# Patient Record
Sex: Female | Born: 1970 | Race: White | Hispanic: No | Marital: Single | Smoking: Never smoker
Health system: Southern US, Community
[De-identification: ages and names within clinical notes are randomized; demographics above are authoritative.]

## PROBLEM LIST (undated history)

## (undated) HISTORY — PX: TUBAL LIGATION: SHX77

---

## 2017-04-26 ENCOUNTER — Encounter: Payer: Self-pay | Admitting: Obstetrics & Gynecology

## 2017-04-26 ENCOUNTER — Ambulatory Visit (INDEPENDENT_AMBULATORY_CARE_PROVIDER_SITE_OTHER): Payer: BLUE CROSS/BLUE SHIELD | Admitting: Obstetrics & Gynecology

## 2017-04-26 VITALS — BP 110/74 | Ht 65.0 in | Wt 216.0 lb

## 2017-04-26 DIAGNOSIS — D219 Benign neoplasm of connective and other soft tissue, unspecified: Secondary | ICD-10-CM

## 2017-04-26 DIAGNOSIS — Z1151 Encounter for screening for human papillomavirus (HPV): Secondary | ICD-10-CM | POA: Diagnosis not present

## 2017-04-26 DIAGNOSIS — Z01419 Encounter for gynecological examination (general) (routine) without abnormal findings: Secondary | ICD-10-CM | POA: Diagnosis not present

## 2017-04-26 DIAGNOSIS — Z9851 Tubal ligation status: Secondary | ICD-10-CM

## 2017-04-26 MED ORDER — PROGESTERONE MICRONIZED 100 MG PO CAPS: 100.0000 mg | ORAL_CAPSULE | Freq: Every day | ORAL | 4 refills | Status: DC

## 2017-04-26 NOTE — Progress Notes (Addendum)
Alexandra [name] [birth year] [id number]   History:    46 y.o. G3P2A1L2  Boyfriend.  S/P Tubal Ligation  RP:  New patient presenting for annual gyn exam and Uterine Fibroid  HPI:  H/O Endometritis on EBx, treated with Vibramycin 01/2016.  Had Menometrorrhagia controled with Cyclic Provera 10 mg x 10 days/month since that time.  Menses regular every month with normal flow on Cyclic Provera.  Patient requesting a "Bioidentical" hormonal therapy.  Pelvic US 06/2016:  Left simple Ovarian Cyst decreased in size from 5.9 cm to 1.8 cm. 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  No pelvic pain, but some pelvic pressure.  No UTI Sx.  BMs wnl.  Breasts wnl.  BMI 35.94.  Past medical history,surgical history, family history and social history were all reviewed and documented in the EPIC chart.  Gynecologic History Patient's last menstrual period was 04/16/2017. Contraception: tubal ligation Last Pap: 06/2016. Results were: Negative/HPV HR neg.  Gono/Chlam neg 06/2016. Last mammogram: 06/2016. Results were: negative  Obstetric History OB History  Gravida Para Term Preterm AB Living  3 2     1 2   SAB TAB Ectopic Multiple Live Births  1            # Outcome Date GA Lbr Len/2nd Weight Sex Delivery Anes PTL Lv  3 SAB           2 Para           1 Para                ROS: A ROS was performed and pertinent positives and negatives are included in the history.  GENERAL: No fevers or chills. HEENT: No change in vision, no earache, sore throat or sinus congestion. NECK: No pain or stiffness. CARDIOVASCULAR: No chest pain or pressure. No palpitations. PULMONARY: No shortness of breath, cough or wheeze. GASTROINTESTINAL: No abdominal pain, nausea, vomiting or diarrhea, melena or bright red blood per rectum. GENITOURINARY: No urinary frequency, urgency, hesitancy or dysuria. MUSCULOSKELETAL: No joint or muscle pain, no back pain, no recent trauma. DERMATOLOGIC: No rash, no itching, no lesions. ENDOCRINE: No polyuria, polydipsia, no  heat or cold intolerance. No recent change in weight. HEMATOLOGICAL: No anemia or easy bruising or bleeding. NEUROLOGIC: No headache, seizures, numbness, tingling or weakness. PSYCHIATRIC: No depression, no loss of interest in normal activity or change in sleep pattern.     Exam:   BP 110/74   Ht 5\' 5"  (1.651 m)   Wt 216 lb (98 kg)   LMP 04/16/2017   BMI 35.94 kg/m   Body mass index is 35.94 kg/m.  General appearance : Well developed well nourished female. No acute distress HEENT: Eyes: no retinal hemorrhage or exudates,  Neck supple, trachea midline, no carotid bruits, no thyroidmegaly Lungs: Clear to auscultation, no rhonchi or wheezes, or rib retractions  Heart: Regular rate and rhythm, no murmurs or gallops Breast:Examined in sitting and supine position were symmetrical in appearance, no palpable masses or tenderness,  no skin retraction, no nipple inversion, no nipple discharge, no skin discoloration, no axillary or supraclavicular lymphadenopathy Abdomen: no palpable masses or tenderness, no rebound or guarding Extremities: no edema or skin discoloration or tenderness  Pelvic: Vulva normal  B[place]olin, Urethra, Skene Glands: Within normal limits             Vagina: No gross lesions or discharge  Cervix: No gross lesions or discharge.  Pap/HR HPV done at patient's request.  Uterus  AV, normal size, shape  and consistency, non-tender and mobile  Adnexa  Without masses or tenderness  Anus and perineum  normal   Labs 04/09/2017: Glucose 84, AST ALT normal, creatinine 0.94 normal, fasting lipid profile with a total cholesterol over HDL ratio at 3.2 and normal triglycerides, TSH normal at 1.46, hemoglobin 13.3, white blood cell 6.6, platelets 282, DHEAS 32.6, estradiol 132.7, progesterone 0.1.   Assessment/Plan:  46 y.o. female for annual exam   1. Encounter for routine gynecological examination with Papanicolaou smear of cervix Normal gynecologic exam.  Pap with high-risk HPV  done.  Breast exam normal.  Screening mammogram negative February 2018.  Recent health labs normal November 2018. - PAP,TP IMGw/HPV RNA,rflx [id number],69/48  2. Special screening examination for human papillomavirus (HPV)  - PAP,TP IMGw/HPV RNA,rflx [id number],09/38  3. Tubal ligation status  4. Fibroid IM Myoma stable at 1.6 cm per Pelvic US 06/2016.  Patient reassured, will observe.  H/O Menometrorrhagia controled with Cyclic Provera 10 mg x 10 days/month.  Patient prefers a BioIdentical hormonal treatment.  Start on Micronized Progesterone 100 mg PO at bedtime every night.  Counseling on above issues more than 50% for 10 minutes  Princess Bruins MD, 2:30 PM 04/26/2017

## 2017-04-28 ENCOUNTER — Encounter: Payer: Self-pay | Admitting: Obstetrics & Gynecology

## 2017-04-28 NOTE — Patient Instructions (Signed)
1. Encounter for routine gynecological examination with Papanicolaou smear of cervix Normal gynecologic exam.  Pap with high-risk HPV done.  Breast exam normal.  Screening mammogram negative February 2018.  Recent health labs normal November 2018. - PAP,TP IMGw/HPV RNA,rflx [id number],01/75  2. Special screening examination for human papillomavirus (HPV)  - PAP,TP IMGw/HPV RNA,rflx [id number],82/42  3. Tubal ligation status Counseling on above issues more than 50% for 4. Fibroid IM Myoma stable at 1.6 cm per Pelvic US 06/2016.  Patient reassured, will observe.  H/O Menometrorrhagia controled with Cyclic Provera 10 mg x 10 days/month.  Patient prefers a BioIdentical hormonal treatment.  Start on Micronized Progesterone 100 mg PO at bedtime every night.  Alexandra [name], it was a pleasure meeting you today!  I will inform you of your results as soon as available.  Health Maintenance, Female Adopting a healthy lifestyle and getting preventive care can go a long way to promote health and wellness. Talk with your health care provider about what schedule of regular examinations is right for you. This is a good chance for you to check in with your provider about disease prevention and staying healthy. In between checkups, there are plenty of things you can do on your own. Experts have done a lot of research about which lifestyle changes and preventive measures are most likely to keep you healthy. Ask your health care provider for more information. Weight and diet Eat a healthy diet  Be sure to include plenty of vegetables, fruits, low-fat dairy products, and lean protein.  Do not eat a lot of foods high in solid fats, added sugars, or salt.  Get regular exercise. This is one of the most important things you can do for your health. ? Most adults should exercise for at least 150 minutes each week. The exercise should increase your heart rate and make you sweat (moderate-intensity exercise). ? Most adults  should also do strengthening exercises at least twice a week. This is in addition to the moderate-intensity exercise.  Maintain a healthy weight  Body mass index (BMI) is a measurement that can be used to identify possible weight problems. It estimates body fat based on height and weight. Your health care provider can help determine your BMI and help you achieve or maintain a healthy weight.  For females 37 years of age and older: ? A BMI below 18.5 is considered underweight. ? A BMI of 18.5 to 24.9 is normal. ? A BMI of 25 to 29.9 is considered overweight. ? A BMI of 30 and above is considered obese.  Watch levels of cholesterol and blood lipids  You should start having your blood tested for lipids and cholesterol at 48 years of age, then have this test every 5 years.  You may need to have your cholesterol levels checked more often if: ? Your lipid or cholesterol levels are high. ? You are older than 48 years of age. ? You are at high risk for heart disease.  Cancer screening Lung Cancer  Lung cancer screening is recommended for adults 18-51 years old who are at high risk for lung cancer because of a history of smoking.  A yearly low-dose CT scan of the lungs is recommended for people who: ? Currently smoke. ? Have quit within the past 15 years. ? Have at least a 30-pack-year history of smoking. A pack year is smoking an average of one pack of cigarettes a day for 1 year.  Yearly screening should continue until it has been 15 years  since you quit.  Yearly screening should stop if you develop a health problem that would prevent you from having lung cancer treatment.  Breast Cancer  Practice breast self-awareness. This means understanding how your breasts normally appear and feel.  It also means doing regular breast self-exams. Let your health care provider know about any changes, no matter how small.  If you are in your 20s or 30s, you should have a clinical breast exam (CBE)  by a health care provider every 1-3 years as part of a regular health exam.  If you are 29 or older, have a CBE every year. Also consider having a breast X-ray (mammogram) every year.  If you have a family history of breast cancer, talk to your health care provider about genetic screening.  If you are at high risk for breast cancer, talk to your health care provider about having an MRI and a mammogram every year.  Breast cancer gene (BRCA) assessment is recommended for women who have family members with BRCA-related cancers. BRCA-related cancers include: ? Breast. ? Ovarian. ? Tubal. ? Peritoneal cancers.  Results of the assessment will determine the need for genetic counseling and BRCA1 and BRCA2 testing.  Cervical Cancer Your health care provider may recommend that you be screened regularly for cancer of the pelvic organs (ovaries, uterus, and vagina). This screening involves a pelvic examination, including checking for microscopic changes to the surface of your cervix (Pap test). You may be encouraged to have this screening done every 3 years, beginning at age 14.  For women ages 63-65, health care providers may recommend pelvic exams and Pap testing every 3 years, or they may recommend the Pap and pelvic exam, combined with testing for human papilloma virus (HPV), every 5 years. Some types of HPV increase your risk of cervical cancer. Testing for HPV may also be done on women of any age with unclear Pap test results.  Other health care providers may not recommend any screening for nonpregnant women who are considered low risk for pelvic cancer and who do not have symptoms. Ask your health care provider if a screening pelvic exam is right for you.  If you have had past treatment for cervical cancer or a condition that could lead to cancer, you need Pap tests and screening for cancer for at least 20 years after your treatment. If Pap tests have been discontinued, your risk factors (such as  having a new sexual partner) need to be reassessed to determine if screening should resume. Some women have medical problems that increase the chance of getting cervical cancer. In these cases, your health care provider may recommend more frequent screening and Pap tests.  Colorectal Cancer  This type of cancer can be detected and often prevented.  Routine colorectal cancer screening usually begins at 48 years of age and continues through 48 years of age.  Your health care provider may recommend screening at an earlier age if you have risk factors for colon cancer.  Your health care provider may also recommend using home test kits to check for hidden blood in the stool.  A small camera at the end of a tube can be used to examine your colon directly (sigmoidoscopy or colonoscopy). This is done to check for the earliest forms of colorectal cancer.  Routine screening usually begins at age 106.  Direct examination of the colon should be repeated every 5-10 years through 48 years of age. However, you may need to be screened more often if  early forms of precancerous polyps or small growths are found.  Skin Cancer  Check your skin from head to toe regularly.  Tell your health care provider about any new moles or changes in moles, especially if there is a change in a mole's shape or color.  Also tell your health care provider if you have a mole that is larger than the size of a pencil eraser.  Always use sunscreen. Apply sunscreen liberally and repeatedly throughout the day.  Protect yourself by wearing long sleeves, pants, a wide-brimmed hat, and sunglasses whenever you are outside.  Heart disease, diabetes, and high blood pressure  High blood pressure causes heart disease and increases the risk of stroke. High blood pressure is more likely to develop in: ? People who have blood pressure in the high end of the normal range (130-139/85-89 mm Hg). ? People who are overweight or  obese. ? People who are African American.  If you are 90-52 years of age, have your blood pressure checked every 3-5 years. If you are 40 years of age or older, have your blood pressure checked every year. You should have your blood pressure measured twice-once when you are at a hospital or clinic, and once when you are not at a hospital or clinic. Record the average of the two measurements. To check your blood pressure when you are not at a hospital or clinic, you can use: ? An automated blood pressure machine at a pharmacy. ? A home blood pressure monitor.  If you are between 13 years and 26 years old, ask your health care provider if you should take aspirin to prevent strokes.  Have regular diabetes screenings. This involves taking a blood sample to check your fasting blood sugar level. ? If you are at a normal weight and have a low risk for diabetes, have this test once every three years after 48 years of age. ? If you are overweight and have a high risk for diabetes, consider being tested at a younger age or more often. Preventing infection Hepatitis B  If you have a higher risk for hepatitis B, you should be screened for this virus. You are considered at high risk for hepatitis B if: ? You were born in a country where hepatitis B is common. Ask your health care provider which countries are considered high risk. ? Your parents were born in a high-risk country, and you have not been immunized against hepatitis B (hepatitis B vaccine). ? You have HIV or AIDS. ? You use needles to inject street drugs. ? You live with someone who has hepatitis B. ? You have had sex with someone who has hepatitis B. ? You get hemodialysis treatment. ? You take certain medicines for conditions, including cancer, organ transplantation, and autoimmune conditions.  Hepatitis C  Blood testing is recommended for: ? Everyone born from [birth year] through 1967. ? Anyone with known risk factors for hepatitis  C.  Sexually transmitted infections (STIs)  You should be screened for sexually transmitted infections (STIs) including gonorrhea and chlamydia if: ? You are sexually active and are younger than 48 years of age. ? You are older than 48 years of age and your health care provider tells you that you are at risk for this type of infection. ? Your sexual activity has changed since you were last screened and you are at an increased risk for chlamydia or gonorrhea. Ask your health care provider if you are at risk.  If you do not have  HIV, but are at risk, it may be recommended that you take a prescription medicine daily to prevent HIV infection. This is called pre-exposure prophylaxis (PrEP). You are considered at risk if: ? You are sexually active and do not regularly use condoms or know the HIV status of your partner(s). ? You take drugs by injection. ? You are sexually active with a partner who has HIV.  Talk with your health care provider about whether you are at high risk of being infected with HIV. If you choose to begin PrEP, you should first be tested for HIV. You should then be tested every 3 months for as long as you are taking PrEP. Pregnancy  If you are premenopausal and you may become pregnant, ask your health care provider about preconception counseling.  If you may become pregnant, take 400 to 800 micrograms (mcg) of folic acid every day.  If you want to prevent pregnancy, talk to your health care provider about birth control (contraception). Osteoporosis and menopause  Osteoporosis is a disease in which the bones lose minerals and strength with aging. This can result in serious bone fractures. Your risk for osteoporosis can be identified using a bone density scan.  If you are 64 years of age or older, or if you are at risk for osteoporosis and fractures, ask your health care provider if you should be screened.  Ask your health care provider whether you should take a calcium or  vitamin D supplement to lower your risk for osteoporosis.  Menopause may have certain physical symptoms and risks.  Hormone replacement therapy may reduce some of these symptoms and risks. Talk to your health care provider about whether hormone replacement therapy is right for you. Follow these instructions at home:  Schedule regular health, dental, and eye exams.  Stay current with your immunizations.  Do not use any tobacco products including cigarettes, chewing tobacco, or electronic cigarettes.  If you are pregnant, do not drink alcohol.  If you are breastfeeding, limit how much and how often you drink alcohol.  Limit alcohol intake to no more than 1 drink per day for nonpregnant women. One drink equals 12 ounces of beer, 5 ounces of wine, or 1 ounces of hard liquor.  Do not use street drugs.  Do not share needles.  Ask your health care provider for help if you need support or information about quitting drugs.  Tell your health care provider if you often feel depressed.  Tell your health care provider if you have ever been abused or do not feel safe at home. This information is not intended to replace advice given to you by your health care provider. Make sure you discuss any questions you have with your health care provider. Document Released: 11/28/2010 Document Revised: 10/21/2015 Document Reviewed: 02/16/2015 Elsevier Interactive Patient Education  [employer].

## 2017-05-01 LAB — PAP, TP IMAGING W/ HPV RNA, RFLX HPV TYPE 16,18/45: HPV DNA HIGH RISK: NOT DETECTED

## 2018-02-20 ENCOUNTER — Telehealth: Payer: Self-pay | Admitting: *Deleted

## 2018-02-20 NOTE — Telephone Encounter (Signed)
Patient called back c/o green vaginal discharge, transferred to appointment desk to schedule visit.

## 2018-02-20 NOTE — Telephone Encounter (Signed)
Patient called and left message in triage voicemail c/o issues, I called and received VM, message left.

## 2018-02-25 ENCOUNTER — Ambulatory Visit (INDEPENDENT_AMBULATORY_CARE_PROVIDER_SITE_OTHER): Payer: BLUE CROSS/BLUE SHIELD | Admitting: Obstetrics & Gynecology

## 2018-02-25 ENCOUNTER — Encounter: Payer: Self-pay | Admitting: Obstetrics & Gynecology

## 2018-02-25 VITALS — BP 124/86

## 2018-02-25 DIAGNOSIS — N898 Other specified noninflammatory disorders of vagina: Secondary | ICD-10-CM | POA: Diagnosis not present

## 2018-02-25 DIAGNOSIS — N951 Menopausal and female climacteric states: Secondary | ICD-10-CM | POA: Diagnosis not present

## 2018-02-25 DIAGNOSIS — Z113 Encounter for screening for infections with a predominantly sexual mode of transmission: Secondary | ICD-10-CM

## 2018-02-25 LAB — WET PREP FOR TRICH, YEAST, CLUE

## 2018-02-25 NOTE — Progress Notes (Signed)
    Alexandra [name] [birth year] [id number]        47 y.o.  [id number] New partner  RP: Greenish vaginal discharge  HPI: Unprotected IC with new partner 1 1/2 week ago.  Greenish vaginal discharge since then.  No pelvic pain.  No fever.  Perimenopausal, had withdrawal bleeding with Prometrium every month.  stopped Prometrium 2 cycles ago.  No vaginal bleeding since then.   OB History  Gravida Para Term Preterm AB Living  3 2     1 2   SAB TAB Ectopic Multiple Live Births  1            # Outcome Date GA Lbr Len/2nd Weight Sex Delivery Anes PTL Lv  3 SAB           2 Para           1 Para             Past medical history,surgical history, problem list, medications, allergies, family history and social history were all reviewed and documented in the EPIC chart.   Directed ROS with pertinent positives and negatives documented in the history of present illness/assessment and plan.  Exam:  Vitals:   02/25/18 1620  BP: 124/86   General appearance:  Normal  Abdomen: Normal  Gynecologic exam: Vulva normal.  Speculum:  Cervix normal.  Vagina normal.  Increased vaginal discharge.  Wet prep done.  Gono-Chlam done.  Bimanual exam:  Cervix non-tender.  Uterus AV, mobile, normal volume, NT.  No adnexal mass felt.  Wet prep negative   Assessment/Plan:  47 y.o. [id number]   1. Vaginal discharge Wet prep negative.  Patient informed.  Pending Gono-Chlam. - WET PREP FOR [place], YEAST, CLUE  2. Screen for STD (sexually transmitted disease) Strict condom use strongly recommended. - HIV antibody (with reflex) - RPR - Hepatitis C Antibody - Hepatitis B Surface AntiGEN - CBC w/Diff  3. Perimenopause Unprotected IC.  Will verify sPT.  Probably perimenopausal.  Will verify [place] today.  Recommend cyclic progesterone every 3 months if no spontaneous menstrual period. - FSH - hCG, serum, qualitative  F/U Annual/Gyn visit in 03/2018.  Counseling on above issues and coordination of care >50% x 25  minutes  Princess Bruins MD, 4:31 PM 02/25/2018

## 2018-02-26 ENCOUNTER — Encounter: Payer: Self-pay | Admitting: Obstetrics & Gynecology

## 2018-02-26 LAB — FOLLICLE STIMULATING HORMONE: FSH: 11.1 m[IU]/mL

## 2018-02-26 LAB — CBC WITH DIFFERENTIAL/PLATELET
BASOS ABS: 38 {cells}/uL (ref 0–200)
BASOS PCT: 0.4 %
Eosinophils Absolute: 19 cells/uL (ref 15–500)
Eosinophils Relative: 0.2 %
HCT: 41.3 % (ref 35.0–45.0)
Hemoglobin: 13.9 g/dL (ref 11.7–15.5)
Lymphs Abs: 1306 cells/uL (ref 850–3900)
MCH: 30.3 pg (ref 27.0–33.0)
MCHC: 33.7 g/dL (ref 32.0–36.0)
MCV: 90 fL (ref 80.0–100.0)
MPV: 10.8 fL (ref 7.5–12.5)
Monocytes Relative: 6.3 %
Neutro Abs: 7632 cells/uL (ref 1500–7800)
Neutrophils Relative %: 79.5 %
Platelets: 348 10*3/uL (ref 140–400)
RBC: 4.59 10*6/uL (ref 3.80–5.10)
RDW: 12.7 % (ref 11.0–15.0)
Total Lymphocyte: 13.6 %
WBC mixed population: 605 cells/uL (ref 200–950)
WBC: 9.6 10*3/uL (ref 3.8–10.8)

## 2018-02-26 LAB — HIV ANTIBODY (ROUTINE TESTING W REFLEX): HIV: NONREACTIVE

## 2018-02-26 LAB — RPR: RPR Ser Ql: NONREACTIVE

## 2018-02-26 LAB — HEPATITIS B SURFACE ANTIGEN: HEP B S AG: NONREACTIVE

## 2018-02-26 LAB — C. TRACHOMATIS/N. GONORRHOEAE RNA
C. TRACHOMATIS RNA, TMA: NOT DETECTED
N. gonorrhoeae RNA, TMA: NOT DETECTED

## 2018-02-26 LAB — HCG, SERUM, QUALITATIVE: Preg, Serum: NEGATIVE

## 2018-02-26 LAB — HEPATITIS C ANTIBODY
Hepatitis C Ab: NONREACTIVE
SIGNAL TO CUT-OFF: 0.02 (ref <1.00)

## 2018-02-26 NOTE — Patient Instructions (Signed)
1. Vaginal discharge Wet prep negative.  Patient informed.  Pending Gono-Chlam. - WET PREP FOR [place], YEAST, CLUE  2. Screen for STD (sexually transmitted disease) Strict condom use strongly recommended. - HIV antibody (with reflex) - RPR - Hepatitis C Antibody - Hepatitis B Surface AntiGEN - CBC w/Diff  3. Perimenopause Unprotected IC.  Will verify sPT.  Probably perimenopausal.  Will verify [place] today.  Recommend cyclic progesterone every 3 months if no spontaneous menstrual period. - FSH - hCG, serum, qualitative  F/U Annual/Gyn visit in 03/2018.  Alexandra [name], good seeing you today!  I will inform you of your results as soon as they are available.

## 2018-07-02 ENCOUNTER — Ambulatory Visit (INDEPENDENT_AMBULATORY_CARE_PROVIDER_SITE_OTHER): Payer: BLUE CROSS/BLUE SHIELD | Admitting: Obstetrics & Gynecology

## 2018-07-02 ENCOUNTER — Encounter: Payer: Self-pay | Admitting: Obstetrics & Gynecology

## 2018-07-02 VITALS — BP 116/70 | Ht 65.0 in | Wt 198.0 lb

## 2018-07-02 DIAGNOSIS — Z1151 Encounter for screening for human papillomavirus (HPV): Secondary | ICD-10-CM

## 2018-07-02 DIAGNOSIS — Z9851 Tubal ligation status: Secondary | ICD-10-CM

## 2018-07-02 DIAGNOSIS — Z01419 Encounter for gynecological examination (general) (routine) without abnormal findings: Secondary | ICD-10-CM

## 2018-07-02 NOTE — Addendum Note (Signed)
Addended by: Thurnell Garbe A on: 07/02/2018 04:45 PM   Modules accepted: Orders

## 2018-07-02 NOTE — Progress Notes (Signed)
Alexandra [name] 1970/08/23 [id number]   History:    48 y.o. G3P2A1L2  Single  S/P TL.  RP:  Established patient presenting for annual gyn exam   HPI: Menses regular every month usually with normal flow for about 5 days.  Occasionally has mildly heavier flow lasting longer, those months patient uses Prometrium 100 mg daily to slow down the flow and stop the period.  No breakthrough bleeding.  No pelvic pain.  Currently abstinent.  Breasts normal.  Decreased weight since last year but modifying her nutrition and increasing aerobic activities.  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  Per patient percentage of fat is now decreased at 29%.  Body mass index is at 32.95 but with heavy muscle mass.  Health labs with family physician.  Past medical history,surgical history, family history and social history were all reviewed and documented in the EPIC chart.  Gynecologic History Patient's last menstrual period was 06/04/2018. Contraception: tubal ligation Last Pap: 03/2017. Results were: Negative/HPV HR neg Last mammogram: Done today, pending results Bone Density: Never Colonoscopy: 6 yrs ago normal, 10 yr schedule  Obstetric History OB History  Gravida Para Term Preterm AB Living  3 2     1 2   SAB TAB Ectopic Multiple Live Births  1            # Outcome Date GA Lbr Len/2nd Weight Sex Delivery Anes PTL Lv  3 SAB           2 Para           1 Para              ROS: A ROS was performed and pertinent positives and negatives are included in the history.  GENERAL: No fevers or chills. HEENT: No change in vision, no earache, sore throat or sinus congestion. NECK: No pain or stiffness. CARDIOVASCULAR: No chest pain or pressure. No palpitations. PULMONARY: No shortness of breath, cough or wheeze. GASTROINTESTINAL: No abdominal pain, nausea, vomiting or diarrhea, melena or bright red blood per rectum. GENITOURINARY: No urinary frequency, urgency, hesitancy or dysuria.  MUSCULOSKELETAL: No joint or muscle pain, no back pain, no recent trauma. DERMATOLOGIC: No rash, no itching, no lesions. ENDOCRINE: No polyuria, polydipsia, no heat or cold intolerance. No recent change in weight. HEMATOLOGICAL: No anemia or easy bruising or bleeding. NEUROLOGIC: No headache, seizures, numbness, tingling or weakness. PSYCHIATRIC: No depression, no loss of interest in normal activity or change in sleep pattern.     Exam:   BP 116/70   Ht 5\' 5"  (1.651 m)   Wt 198 lb (89.8 kg)   LMP 06/04/2018   BMI 32.95 kg/m   Body mass index is 32.95 kg/m.  General appearance : Well developed well nourished female. No acute distress HEENT: Eyes: no retinal hemorrhage or exudates,  Neck supple, trachea midline, no carotid bruits, no thyroidmegaly Lungs: Clear to auscultation, no rhonchi or wheezes, or rib retractions  Heart: Regular rate and rhythm, no murmurs or gallops Breast:Examined in sitting and supine position were symmetrical in appearance, no palpable masses or tenderness,  no skin retraction, no nipple inversion, no nipple discharge, no skin discoloration, no axillary or supraclavicular lymphadenopathy Abdomen: no palpable masses or tenderness, no rebound or guarding Extremities: no edema or skin discoloration or tenderness  Pelvic: Vulva: Normal             Vagina: No gross lesions or discharge  Cervix: No gross lesions or discharge.  Pap/HPV HR done  Uterus  AV, normal size, shape and consistency, non-tender and mobile  Adnexa  Without masses or tenderness  Anus: Normal   Assessment/Plan:  48 y.o. female for annual exam   1. Encounter for routine gynecological examination with Papanicolaou smear of cervix Normal gynecologic exam.  Menstrual periods every month, uses Prometrium to help decrease the bleeding when occasionally longer and heavier.  Will call if needs a re-prescription of the Prometrium 100 mg as needed.  Pap test with high risk HPV done today given  unprotected intercourse since last Pap test.  Breast exam normal.  Screening mammogram done today, pending final results.  Colonoscopy 6 years ago.  Health labs with family physician.  Body mass index 32.95.  With better than average muscle mass, as patient is a Product manager.  Started swimming regularly.  Patient lost weight since last annual exam.  Healthy nutrition.  2. S/P tubal ligation   Princess Bruins MD, 3:29 PM 07/02/2018

## 2018-07-02 NOTE — Patient Instructions (Signed)
1. Encounter for routine gynecological examination with Papanicolaou smear of cervix Normal gynecologic exam.  Menstrual periods every month, uses Prometrium to help decrease the bleeding when occasionally longer and heavier.  Will call if needs a re-prescription of the Prometrium 100 mg as needed.  Pap test with high risk HPV done today given unprotected intercourse since last Pap test.  Breast exam normal.  Screening mammogram done today, pending final results.  Colonoscopy 6 years ago.  Health labs with family physician.  Body mass index 32.95.  With better than average muscle mass, as patient is a Product manager.  Started swimming regularly.  Patient lost weight since last annual exam.  Healthy nutrition.  2. S/P tubal ligation  Alexandra [name], it was a pleasure seeing you today!  I will inform you of your results as soon as they are available

## 2018-07-03 LAB — PAP, TP IMAGING W/ HPV RNA, RFLX HPV TYPE 16,18/45: HPV DNA High Risk: NOT DETECTED

## 2018-10-25 ENCOUNTER — Telehealth: Payer: Self-pay | Admitting: *Deleted

## 2018-10-25 MED ORDER — PROGESTERONE MICRONIZED 100 MG PO CAPS: 100.0000 mg | ORAL_CAPSULE | Freq: Every day | ORAL | 3 refills | Status: AC

## 2018-10-25 NOTE — Telephone Encounter (Signed)
Patient called requesting refill on Progesterone 100 mg cap per note on 07/02/18 "Will call if needs a re-prescription of the Prometrium 100 mg as needed."  Should patient have #30 tablets? Any refills?

## 2018-10-25 NOTE — Telephone Encounter (Signed)
Rx sent,

## 2018-10-25 NOTE — Telephone Encounter (Signed)
Can refill Prometrium 100 mg 1 tab PO HS #90, refill x 3.

## 2019-07-04 ENCOUNTER — Encounter: Payer: BLUE CROSS/BLUE SHIELD | Admitting: Obstetrics & Gynecology

## 2019-07-11 ENCOUNTER — Encounter: Payer: Self-pay | Admitting: Obstetrics & Gynecology

## 2019-07-14 ENCOUNTER — Encounter: Payer: Self-pay | Admitting: Obstetrics & Gynecology

## 2021-04-22 ENCOUNTER — Emergency Department (HOSPITAL_BASED_OUTPATIENT_CLINIC_OR_DEPARTMENT_OTHER): Payer: No Typology Code available for payment source

## 2021-04-22 ENCOUNTER — Other Ambulatory Visit: Payer: Self-pay

## 2021-04-22 ENCOUNTER — Encounter (HOSPITAL_BASED_OUTPATIENT_CLINIC_OR_DEPARTMENT_OTHER): Payer: Self-pay

## 2021-04-22 ENCOUNTER — Emergency Department (HOSPITAL_BASED_OUTPATIENT_CLINIC_OR_DEPARTMENT_OTHER)
Admission: EM | Admit: 2021-04-22 | Discharge: 2021-04-22 | Disposition: A | Discharge status: Home / Self Care | Payer: No Typology Code available for payment source | Attending: Emergency Medicine | Admitting: Emergency Medicine

## 2021-04-22 DIAGNOSIS — X509XXA Other and unspecified overexertion or strenuous movements or postures, initial encounter: Secondary | ICD-10-CM | POA: Diagnosis not present

## 2021-04-22 DIAGNOSIS — S4291XA Fracture of right shoulder girdle, part unspecified, initial encounter for closed fracture: Secondary | ICD-10-CM | POA: Diagnosis not present

## 2021-04-22 DIAGNOSIS — Y9239 Other specified sports and athletic area as the place of occurrence of the external cause: Secondary | ICD-10-CM | POA: Diagnosis not present

## 2021-04-22 DIAGNOSIS — S4991XA Unspecified injury of right shoulder and upper arm, initial encounter: Secondary | ICD-10-CM | POA: Diagnosis present

## 2021-04-22 DIAGNOSIS — S43004A Unspecified dislocation of right shoulder joint, initial encounter: Secondary | ICD-10-CM | POA: Diagnosis not present

## 2021-04-22 IMAGING — DX DG SHOULDER 2+V*R*
2 series · 2 of 2 positions shown · non-contrast
Comparison: None.

CLINICAL DATA: Recurrent shoulder dislocations. Patient reports
shoulder being reduced earlier today.

EXAM:
RIGHT SHOULDER - 2+ VIEW

[shoulder axillary]
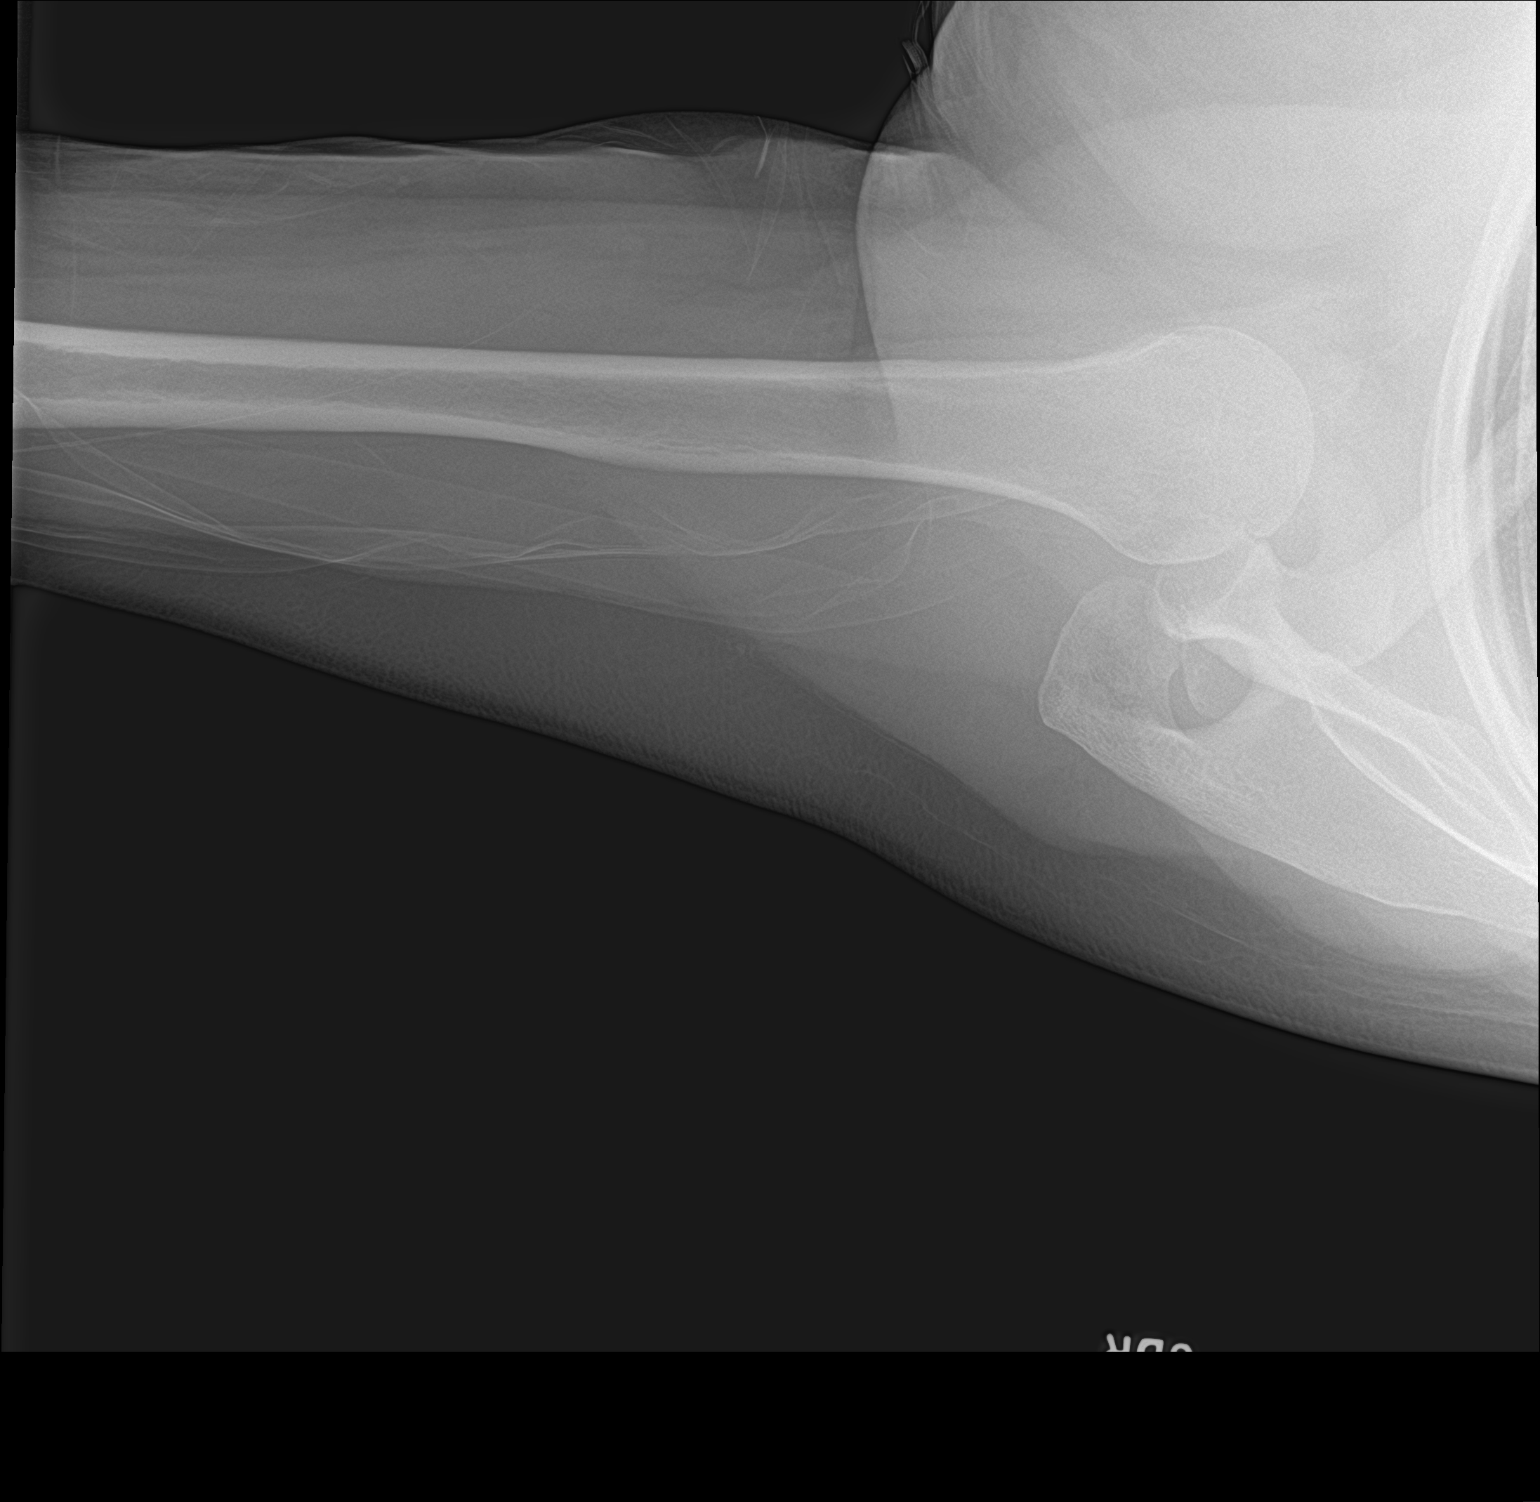

[shoulder ap neutral]
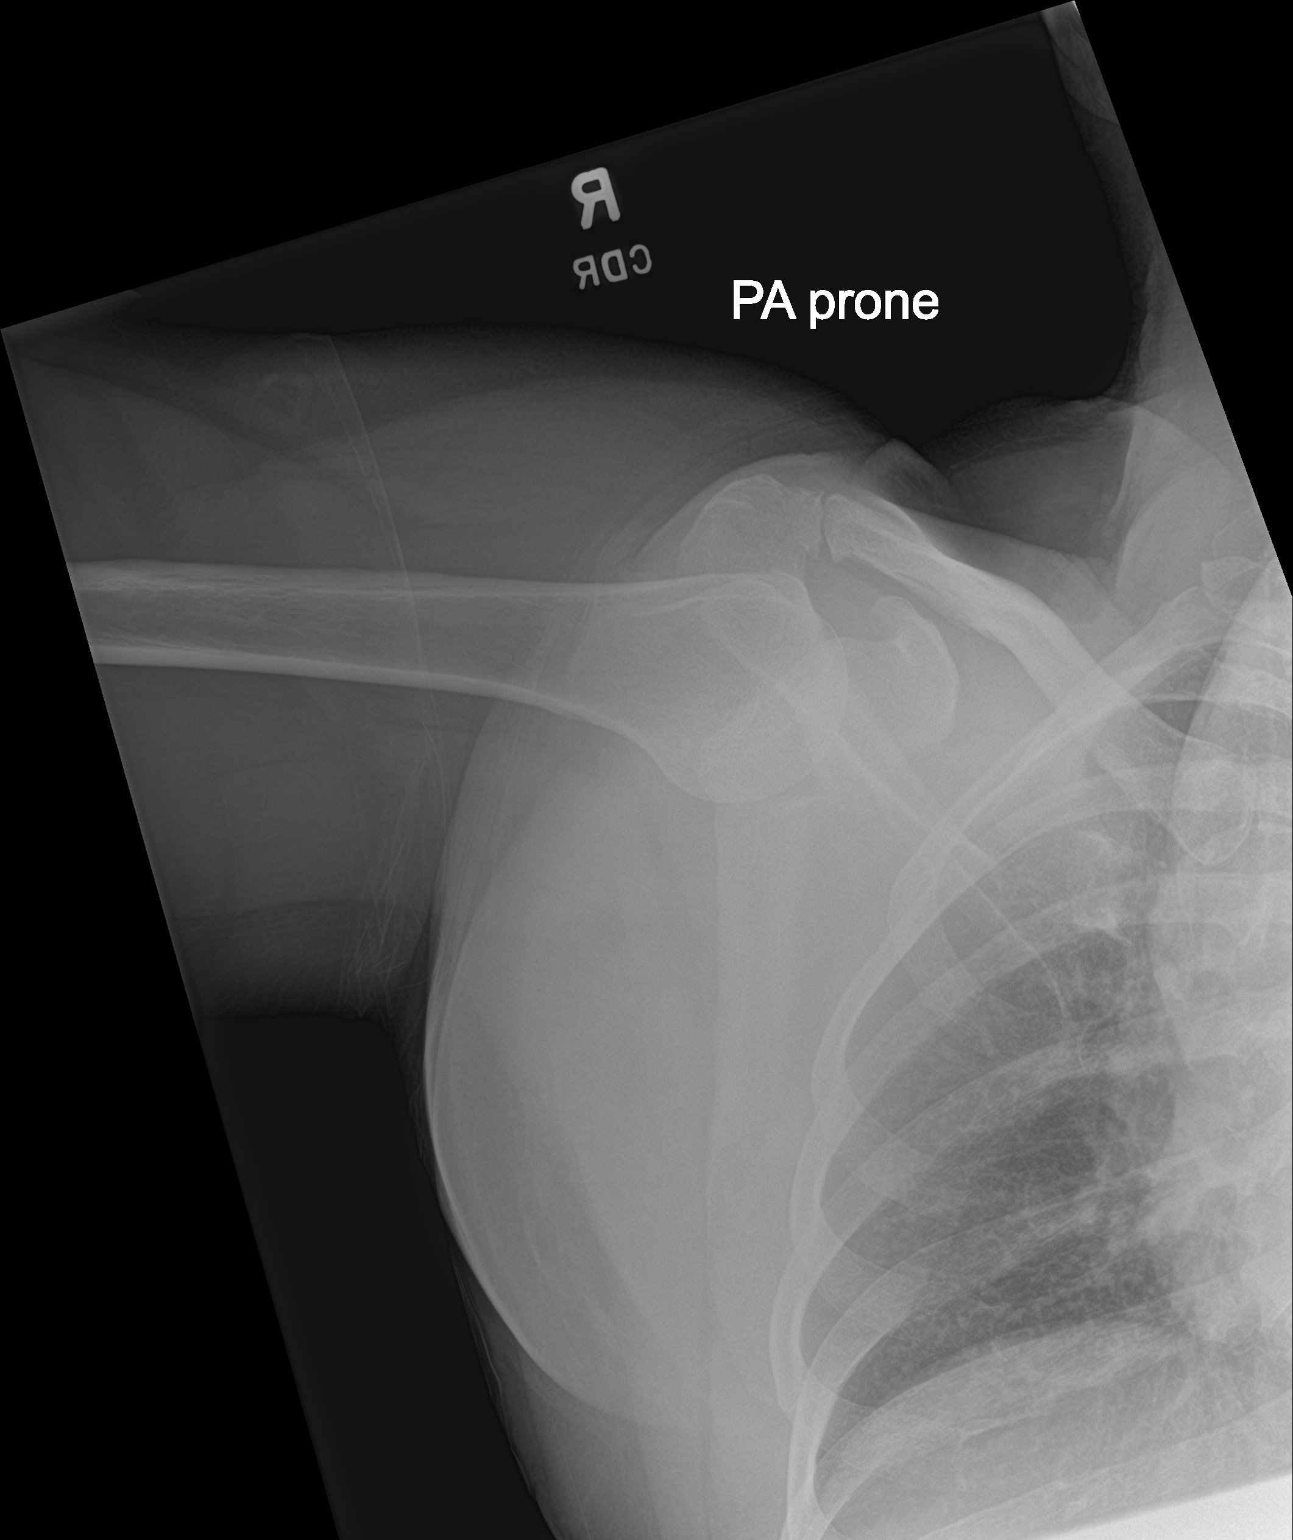

[2 of 2 positions shown; findings below may reference images not displayed]

FINDINGS: PA prone and axillary views are submitted. On the axillary view, the
humeral head is anteriorly dislocated relative to the glenoid. There
is a probable small Hill-Sachs deformity. On the PA view, the
shoulder is abducted. No scapular fracture identified.
IMPRESSION: Anterior dislocation of the glenohumeral joint with probable small
Hill-Sachs deformity.

## 2021-04-22 IMAGING — DX DG SHOULDER 2+V*R*
1 series · 1 of 1 positions shown · non-contrast
Comparison: [DATE] [DATE], [DATE] ([DATE] p.m.)

CLINICAL DATA: Post reduction views.

EXAM:
RIGHT SHOULDER - 2+ VIEW

[shoulder ap]
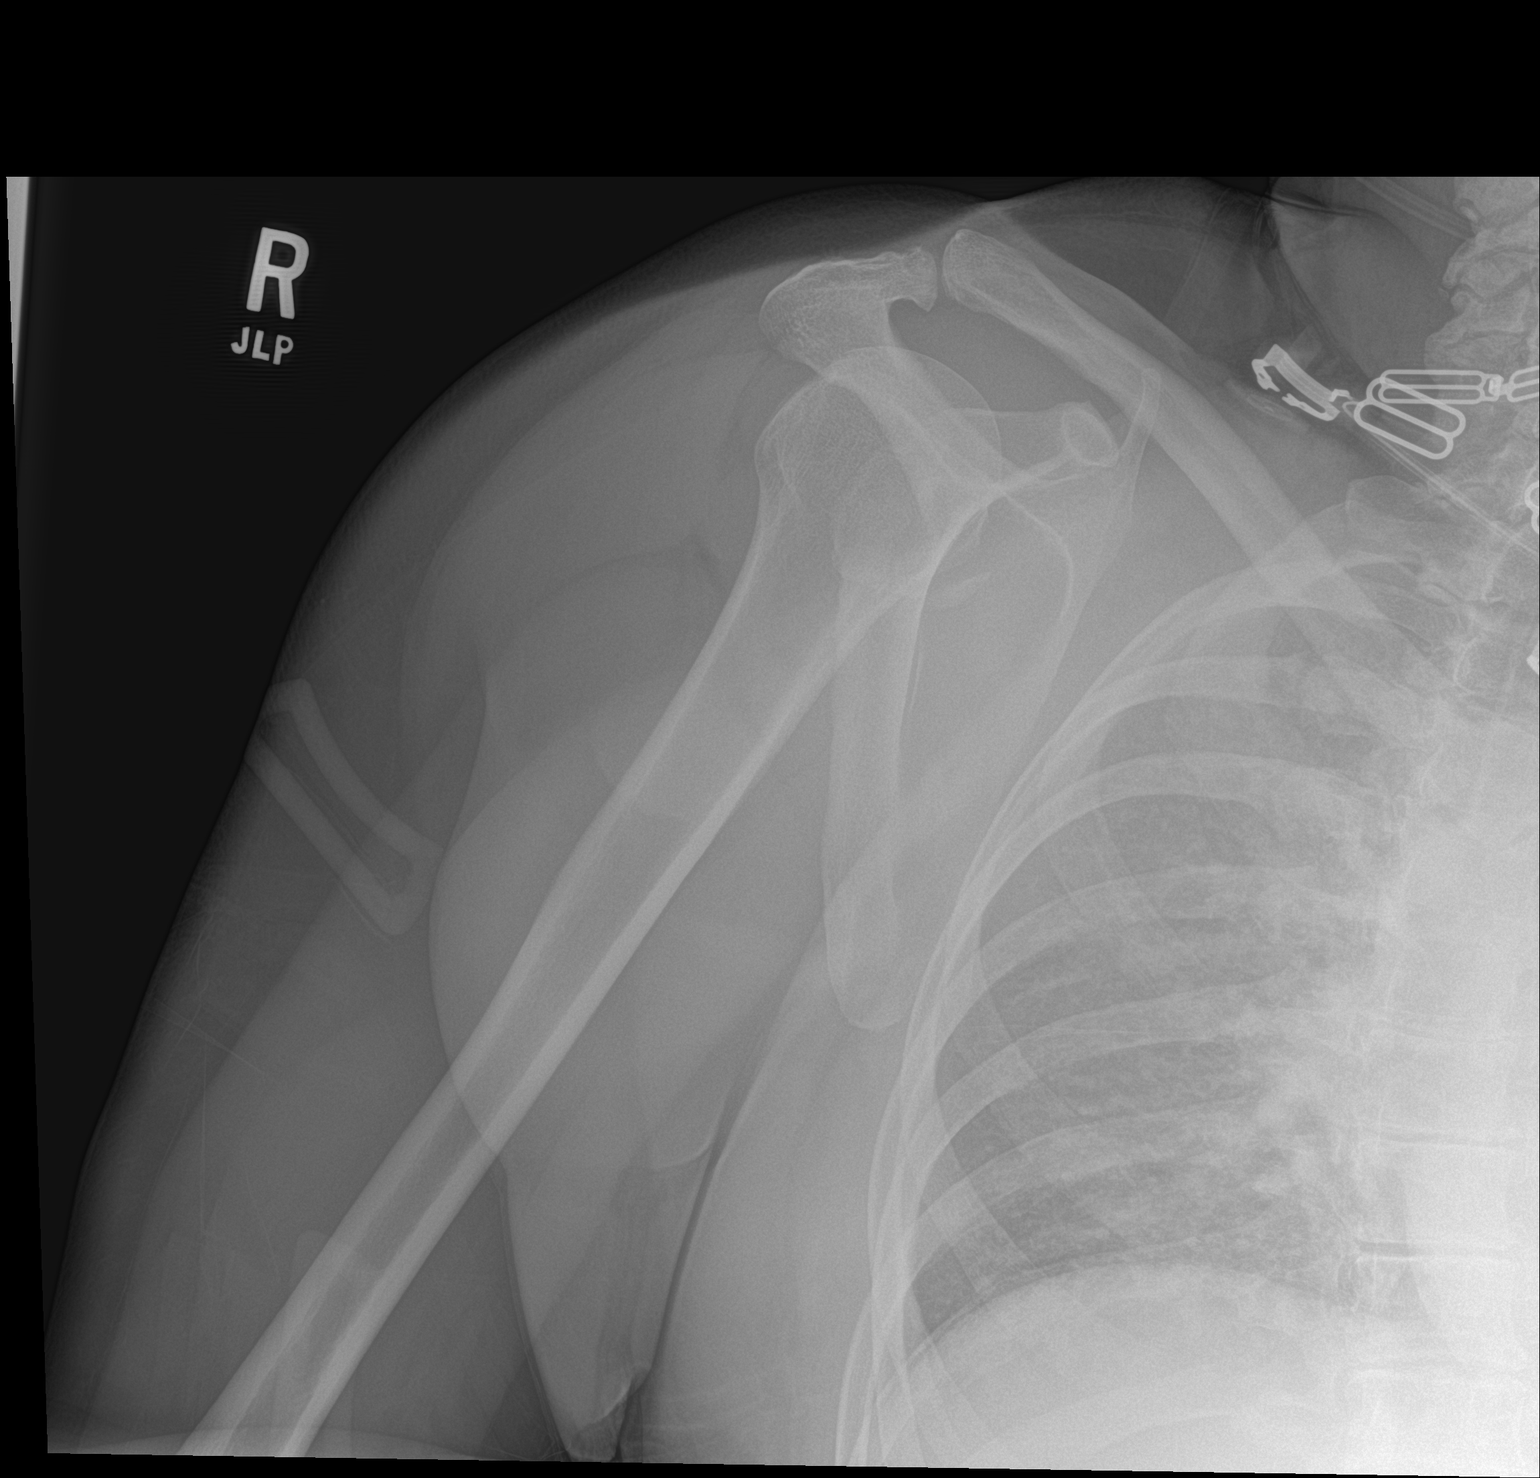

[1 of 1 positions shown; findings below may reference images not displayed]

FINDINGS: A mildly displaced fracture fragment is seen adjacent to the medial
aspect of the right humeral head. There is no evidence of
dislocation. Soft tissues are unremarkable.
IMPRESSION: 1. Mildly displaced fracture fragment adjacent to the right humeral
head.
2. Interval reduction of the anterior dislocation of the right
shoulder seen on the prior study.

## 2021-04-22 MED ORDER — PROPOFOL 10 MG/ML IV BOLUS
INTRAVENOUS | Status: AC | PRN
  Administered 2021-04-22: 20 mg via INTRAVENOUS

## 2021-04-22 MED ORDER — HYDROCODONE-ACETAMINOPHEN 5-325 MG PO TABS: 1.0000 | ORAL_TABLET | ORAL | 0 refills | Status: AC | PRN

## 2021-04-22 MED ORDER — PROPOFOL 10 MG/ML IV BOLUS
0.5000 mg/kg | Freq: Once | INTRAVENOUS | Status: AC
  Administered 2021-04-22: 49.9 mg via INTRAVENOUS
  Filled 2021-04-22: qty 20

## 2021-04-22 MED ORDER — FENTANYL CITRATE PF 50 MCG/ML IJ SOSY
50.0000 ug | PREFILLED_SYRINGE | Freq: Once | INTRAMUSCULAR | Status: AC
  Administered 2021-04-22: 50 ug via INTRAVENOUS
  Filled 2021-04-22: qty 1

## 2021-04-22 NOTE — ED Notes (Signed)
Patient O2 sat decreased to 84-85% on 2 L ETCO2. Patient RR decreased and patient required manual ventilation via BVM. Patient began to wake up and vitals improved. Patient able to maintain airway, but left on 2 L until patient is more awake. ETCO2 30.

## 2021-04-22 NOTE — ED Triage Notes (Signed)
Pt was at gym this morning, dislocated right shoulder. Went to [place] and it was reduced. States dislocated again an hour pta. Pt hyperventilating, in severe pain. States pain down to fingers.

## 2021-04-22 NOTE — ED Provider Notes (Signed)
[facility] DEPARTMENT Provider Note   CSN: [id number] Arrival date & time: 04/22/21  1557     History Chief Complaint  Alexandra [name] presents with   Shoulder Injury    Dislocated    Alexandra Alexandra [name] Alexandra [name] is a 50 y.o. female.  Alexandra [name] is a 50 year old female who presents with right shoulder pain.  She feels like it is dislocated.  She said she was at Alexandra Alexandra [name] gym doing shoulder presses this morning and felt a pop in her right shoulder and felt like it dislocated.  She dropped Alexandra Alexandra [name] weights.  She went to an urgent care so she with Alexandra Alexandra [name] [facility]andra [name] hospital and was able to massage it and self reduce it while she was waiting to be seen.  She had an x-ray which appeared normal at that visit this morning.  She said when she got home she was taking her shirt off and felt like it dislocated again.  This happened about an hour prior to arrival.  She has severe pain in her right shoulder.  She denies any numbness or weakness in her hand.  She has never had this happen previously.      History reviewed. No pertinent past medical history.  There are no problems to display for this Alexandra [name].   Past Surgical History:  Procedure Laterality Date   TUBAL LIGATION       OB History     Gravida  3   Para  2   Term      Preterm      AB  1   Living  2      SAB  1   IAB      Ectopic      Multiple      Live Births              History reviewed. No pertinent family history.  Social History   Tobacco Use   Smoking status: Never   Smokeless tobacco: Never  Vaping Use   Vaping Use: Never used  Substance Use Topics   Alcohol use: Yes    Comment: RARE    Home Medications Prior to Admission medications   Medication Sig Start Date End Date Taking? Authorizing Provider  HYDROcodone-acetaminophen (NORCO/VICODIN) 5-325 MG tablet Take 1 tablet by mouth every 4 (four) hours as needed. 04/22/21  Yes Malvin Johns, MD  ALPRAZolam Duanne Moron) 0.5 MG tablet Take 0.5 mg by mouth at bedtime  as needed for anxiety.    [provider]  predniSONE (DELTASONE) 5 MG tablet Take 5 mg by mouth daily with breakfast.    [provider]  progesterone (PROMETRIUM) 100 MG capsule Take 1 capsule (100 mg total) by mouth at bedtime. 10/25/18   Princess Bruins, MD    Allergies    Morphine and related and Oxycodone  Review of Systems   Review of Systems  Constitutional:  Negative for fever.  Gastrointestinal:  Negative for nausea and vomiting.  Musculoskeletal:  Positive for arthralgias. Negative for back pain, joint swelling and neck pain.  Skin:  Negative for wound.  Neurological:  Negative for weakness, numbness and headaches.  All other systems reviewed and are negative.  Physical Exam Updated Vital Signs BP (!) 116/95   Pulse 89   Temp 98 F (36.7 C) (Oral)   Resp (!) 22   Ht 5\' 5"  (1.651 m)   Wt 99.8 kg   LMP 04/14/2021 (Approximate)   SpO2 98%   BMI 36.61 kg/m   Physical Exam Constitutional:  Appearance: She is well-developed.  HENT:     Head: Normocephalic and atraumatic.  Eyes:     Pupils: Pupils are equal, round, and reactive to light.  Cardiovascular:     Rate and Rhythm: Normal rate and regular rhythm.     Heart sounds: Normal heart sounds.  Pulmonary:     Effort: Pulmonary effort is normal. No respiratory distress.     Breath sounds: Normal breath sounds. No wheezing or rales.  Chest:     Chest wall: No tenderness.  Abdominal:     General: Bowel sounds are normal.     Palpations: Abdomen is soft.     Tenderness: There is no abdominal tenderness. There is no guarding or rebound.  Musculoskeletal:     Cervical back: Normal range of motion and neck supple.     Comments: Positive tenderness and parent deformity to Alexandra Alexandra [name] right shoulder.  She has limited range of motion at shoulder due to pain.  She has normal sensation and motor function in Alexandra Alexandra [name] hand.  Radial pulses are intact.  Lymphadenopathy:     Cervical: No cervical adenopathy.   Skin:    General: Skin is warm and dry.     Findings: No rash.  Neurological:     Mental Status: She is alert and oriented to person, place, and time.    ED Results / Procedures / Treatments   Labs (all labs ordered are listed, but only abnormal results are displayed) Labs Reviewed - No data to display  EKG None  Radiology DG Shoulder Right  Result Date: 04/22/2021 CLINICAL DATA:  Post reduction views. EXAM: RIGHT SHOULDER - 2+ VIEW COMPARISON:  April 22, 2021 (5:11 p.m.) FINDINGS: A mildly displaced fracture fragment is seen adjacent to Alexandra Alexandra [name] medial aspect of Alexandra Alexandra [name] right humeral head. There is no evidence of dislocation. Soft tissues are unremarkable. IMPRESSION: 1. Mildly displaced fracture fragment adjacent to Alexandra Alexandra [name] right humeral head. 2. Interval reduction of Alexandra Alexandra [name] anterior dislocation of Alexandra Alexandra [name] right shoulder seen on Alexandra Alexandra [name] prior study. Electronically Signed   By: Virgina Norfolk M.D.   On: 04/22/2021 19:22   DG Shoulder Right  Result Date: 04/22/2021 CLINICAL DATA:  Recurrent shoulder dislocations. Alexandra Alexandra [name] Alexandra [name] shoulder being reduced earlier today. EXAM: RIGHT SHOULDER - 2+ VIEW COMPARISON:  None. FINDINGS: PA prone and axillary views are submitted. On Alexandra Alexandra [name] axillary view, Alexandra Alexandra [name] humeral head is anteriorly dislocated relative to Alexandra Alexandra [name] glenoid. There is a probable small Hill-Sachs deformity. On the PA view, Alexandra Alexandra [name] shoulder is abducted. No scapular fracture identified. IMPRESSION: Anterior dislocation of Alexandra Alexandra [name] glenohumeral joint with probable small Hill-Sachs deformity. Electronically Signed   By: Richardean Sale M.D.   On: 04/22/2021 18:00    Procedures Reduction of fracture  Date/Time: 04/22/2021 7:00 PM Performed by: Malvin Johns, MD Authorized by: Malvin Johns, MD  Consent: Written consent obtained. Risks and benefits: risks, benefits and alternatives were discussed Consent given by: Alexandra [name] Alexandra [name] understanding: Alexandra [name] states understanding of Alexandra Alexandra [name] procedure being performed Alexandra [name]  consent: Alexandra Alexandra [name] Alexandra [name]'s understanding of Alexandra Alexandra [name] procedure matches consent given Procedure consent: procedure consent matches procedure scheduled Relevant documents: relevant documents present and verified Test results: test results available and properly labeled Site marked: Alexandra Alexandra [name] operative site was not marked Imaging studies: imaging studies available Alexandra [name] identity confirmed: verbally with Alexandra [name] Time out: Immediately prior to procedure a "time out" was called to verify Alexandra Alexandra [name] correct Alexandra [name], procedure, equipment, support staff and site/side marked as required. Local anesthesia used: no  Anesthesia: Local anesthesia used: no  Sedation: Alexandra [name] sedated: yes  Sedation type: moderate (conscious) sedation Sedatives: propofol Analgesia: fentanyl Sedation start date/time: 04/22/2021 6:40 PM Sedation end date/time: 04/22/2021 7:10 PM  Alexandra [name] tolerance: Alexandra [name] tolerated Alexandra Alexandra [name] procedure well with no immediate complications   .Sedation  Date/Time: 04/22/2021 7:59 PM Performed by: Malvin Johns, MD Authorized by: Malvin Johns, MD   Consent:    Consent obtained:  Written   Consent given by:  Alexandra [name]   Risks discussed:  Allergic reaction, respiratory compromise necessitating ventilatory assistance and intubation and vomiting   Alternatives discussed:  Analgesia without sedation Universal protocol:    Procedure explained and questions answered to Alexandra [name] or proxy's satisfaction: yes     Relevant documents present and verified: yes     Test results available: yes     Imaging studies available: yes     Required blood products, implants, devices, and special equipment available: yes     Site/side marked: no     Immediately prior to procedure, a time out was called: yes     Alexandra [name] identity confirmed:  Verbally with Alexandra [name] Indications:    Procedure performed:  Dislocation reduction   Procedure necessitating sedation performed by:  Physician performing sedation Pre-sedation assessment:     Time since last food or drink:  4   ASA classification: class 2 - Alexandra [name] with mild systemic disease     Mouth opening:  3 or more finger widths   Thyromental distance:  3 finger widths   Mallampati score:  II - soft palate, uvula, fauces visible   Neck mobility: normal     Pre-sedation assessments completed and reviewed: airway patency, cardiovascular function, hydration status, mental status, nausea/vomiting, pain level, respiratory function and temperature     Pre-sedation assessment completed:  04/22/2021 6:30 PM Immediate pre-procedure details:    Reassessment: Alexandra [name] reassessed immediately prior to procedure     Reviewed: vital signs, relevant labs/tests and NPO status     Verified: bag valve mask available, emergency equipment available, intubation equipment available, IV patency confirmed and oxygen available   Procedure details (see MAR for exact dosages):    Preoxygenation:  Nasal cannula   Sedation:  Propofol   Intended level of sedation: deep   Intra-procedure monitoring:  Blood pressure monitoring, cardiac monitor, continuous capnometry, continuous pulse oximetry, frequent LOC assessments and frequent vital sign checks   Intra-procedure events: hypoxia     Intra-procedure management:  BVM ventilation   Total Provider sedation time (minutes):  20 Post-procedure details:    Post-sedation assessment completed:  04/22/2021 8:01 PM   Attendance: Constant attendance by certified staff until Alexandra [name] recovered     Recovery: Alexandra [name] returned to pre-procedure baseline     Post-sedation assessments completed and reviewed: airway patency, cardiovascular function, hydration status, mental status, nausea/vomiting, pain level, respiratory function and temperature     Alexandra [name] is stable for discharge or admission: yes     Procedure completion:  Tolerated well, no immediate complications Comments:     Alexandra [name] got initially 49mg  of propofol.  She had good sedation with this.  I was able to  reduce Alexandra Alexandra [name] shoulder although then it slipped back out.  She started waking up again.  I ordered an additional 20 mg of propofol.  The RN at bedside and is calculated Alexandra Alexandra [name] amount and she was inadvertently given 80 mg of propofol.  She had a brief episode of apnea.  She desaturated to 84% although this lasted only about 15 to 20 seconds.  She was easily ventilated with a bag valve mask and her  oxygen saturations went back up to Alexandra Alexandra [name] high 90s.  She was ventilated for about 1 to 2 minutes with a bag-valve-mask and then she started breathing on her own and woke up appropriately.  She did not have any vomiting or prolonged hypoxia.  She did not have any apparent complications resulting from this.  I did notify Alexandra Alexandra [name] Alexandra [name] that this had happened.   Medications Ordered in ED Medications  fentaNYL (SUBLIMAZE) injection 50 mcg (50 mcg Intravenous Given 04/22/21 1657)  fentaNYL (SUBLIMAZE) injection 50 mcg (50 mcg Intravenous Given 04/22/21 1739)  propofol (DIPRIVAN) 10 mg/mL bolus/IV push 49.9 mg (49.9 mg Intravenous Given 04/22/21 1845)  propofol (DIPRIVAN) 10 mg/mL bolus/IV push (20 mg Intravenous Given 04/22/21 1850)    ED Course  I have reviewed Alexandra Alexandra [name] triage vital signs and Alexandra Alexandra [name] nursing notes.  Pertinent labs & imaging results that were available during my care of Alexandra Alexandra [name] Alexandra [name] were reviewed by me and considered in my medical decision making (see chart for details).    MDM Rules/Calculators/A&P                           Alexandra [name] presents with a shoulder dislocation.  This was reduced under conscious sedation.  It actually easily went back in place with minimal traction.  However it slipped out a couple times and I had to reduce it again.  On Alexandra Alexandra [name] last try, it seemed to stay in place and Alexandra [name] was placed in a shoulder immobilizer.  X-rays were obtained which showed a small avulsion type fracture of Alexandra Alexandra [name] humeral head.  There was also a question of a Hill-Sachs deformity.  X-rays reviewed by me.  She was  monitored following Alexandra Alexandra [name] sedation and had no apparent complications.  She was awake and alert and drinking without difficulty.  No nausea or vomiting.  No hypoxia.  She was discharged home in good condition.  She was given a prescription for small amount of Vicodin.  She was given a referral to follow-up with orthopedics.  Return precautions were given. Final Clinical Impression(s) / ED Diagnoses Final diagnoses:  Shoulder dislocation, right, initial encounter  Closed fracture of right shoulder, initial encounter    Rx / DC Orders ED Discharge Orders          Ordered    HYDROcodone-acetaminophen (NORCO/VICODIN) 5-325 MG tablet  Every 4 hours PRN        04/22/21 1956             Malvin Johns, MD 04/22/21 2006

## 2021-04-22 NOTE — ED Notes (Signed)
Pt given 80 mg propofol vs the ordered dose. Tamera Punt, MD aware. Belfi and RRT at bedside. Patient had brief episode of hypoxia lasting approximately 10 seconds. Pt responsive, maintaining airway, and VSS.

## 2021-04-22 NOTE — Discharge Instructions (Addendum)
Keep your arm in the sling until you follow-up with the orthopedic surgeon.  Follow-up within the next week or so with an orthopedic surgeon.  If you are unable to follow-up with 1 through the [facility] hospital, you can make an appointment with the orthopedist above.  Return to emergency room if you have any worsening symptoms.

## 2021-04-22 NOTE — ED Notes (Signed)
Patient alert, oriented x4 and able to move all 4 extremities. Pt O2 99% on 2L.

## 2021-09-23 ENCOUNTER — Other Ambulatory Visit: Payer: Self-pay | Admitting: Physician Assistant

## 2021-09-23 DIAGNOSIS — M47816 Spondylosis without myelopathy or radiculopathy, lumbar region: Secondary | ICD-10-CM

## 2021-09-23 DIAGNOSIS — M47812 Spondylosis without myelopathy or radiculopathy, cervical region: Secondary | ICD-10-CM

## 2021-10-04 ENCOUNTER — Other Ambulatory Visit: Payer: Self-pay | Admitting: Nurse Practitioner

## 2021-10-04 DIAGNOSIS — M545 Low back pain, unspecified: Secondary | ICD-10-CM

## 2021-10-08 ENCOUNTER — Other Ambulatory Visit: Payer: No Typology Code available for payment source

## 2021-10-12 ENCOUNTER — Ambulatory Visit
Admission: RE | Admit: 2021-10-12 | Discharge: 2021-10-12 | Disposition: A | Discharge status: Home / Self Care | Payer: No Typology Code available for payment source | Source: Ambulatory Visit | Attending: Physician Assistant | Admitting: Physician Assistant

## 2021-10-12 DIAGNOSIS — M47816 Spondylosis without myelopathy or radiculopathy, lumbar region: Secondary | ICD-10-CM

## 2021-10-12 DIAGNOSIS — M47812 Spondylosis without myelopathy or radiculopathy, cervical region: Secondary | ICD-10-CM

## 2021-10-12 IMAGING — MR MR CERVICAL SPINE W/O CM
5 series · 35 of 48 positions shown · non-contrast
Comparison: None Available.

CLINICAL DATA: Spondylosis without myelopathy or radiculopathy,
cervical region [GZ] ([GZ]-CM).

Lumbar spondylosis [GZ] ([GZ]-CM).
EXAM:
MRI CERVICAL AND LUMBAR SPINE WITHOUT CONTRAST
TECHNIQUE: Multiplanar and multiecho pulse sequences of the cervical spine, to
include the craniocervical junction and cervicothoracic junction,
and lumbar spine, were obtained without intravenous contrast.

[Series 2: T2 · sagittal · 3.0mm · 0.41mm/px · 8 of 19 slices shown (1 of 2)]
[im 1/19]
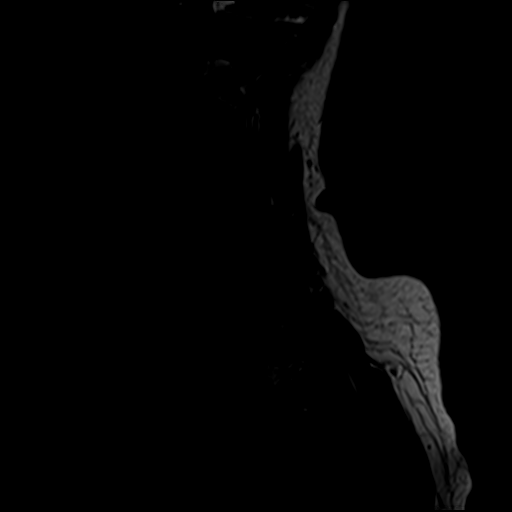
[im 3/19]
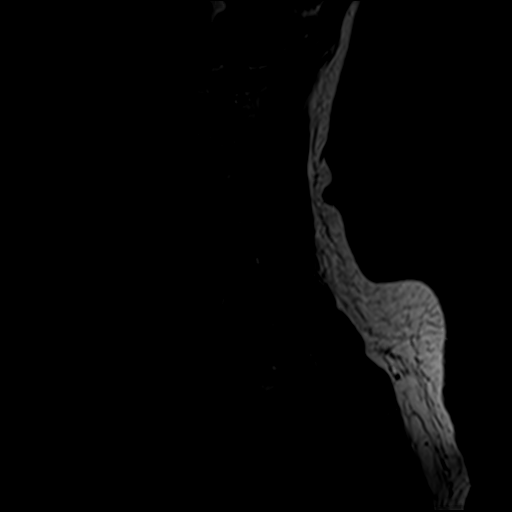
[im 6/19]
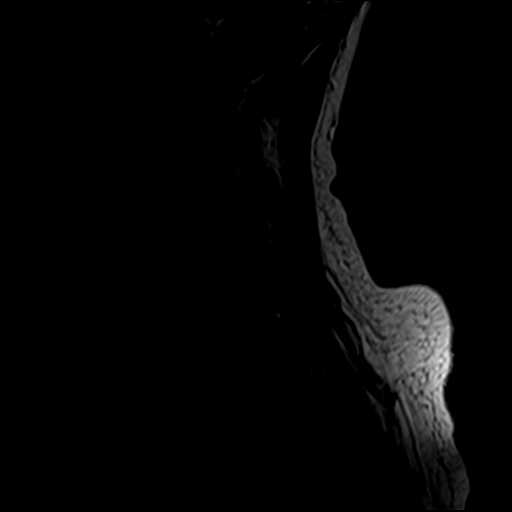
[im 8/19]
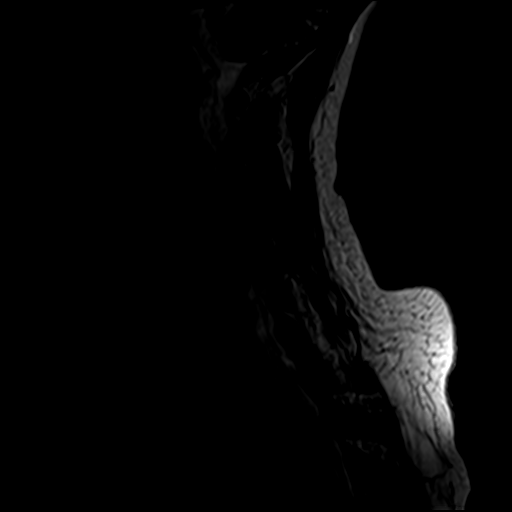
[im 11/19]
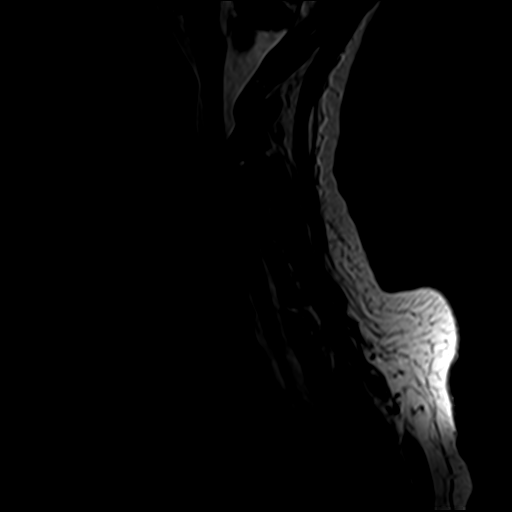
[im 13/19]
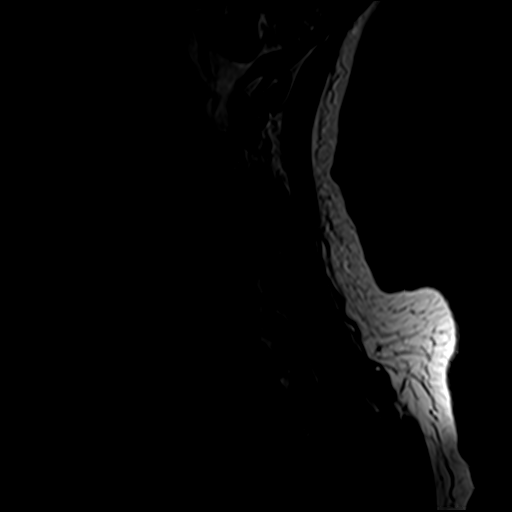
[im 16/19]
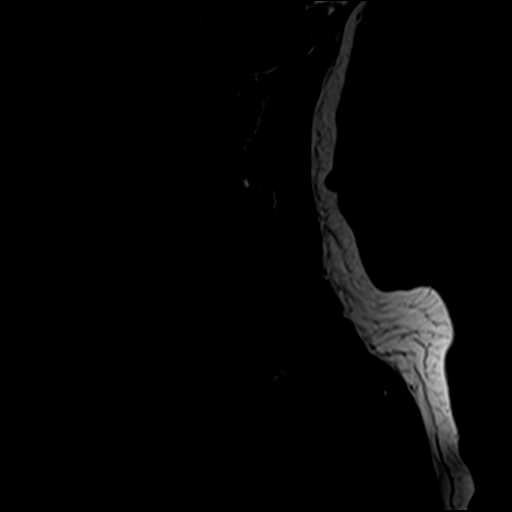
[im 19/19]
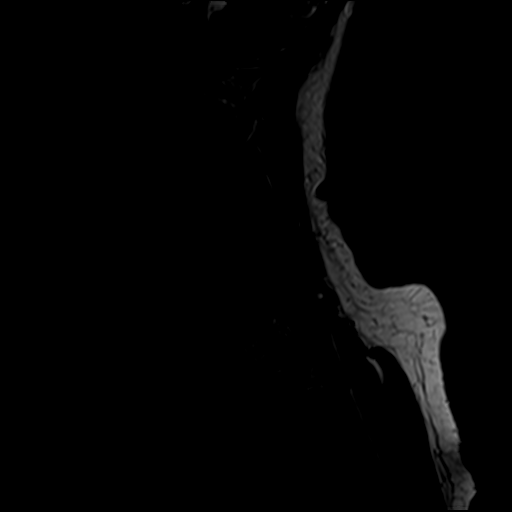

[Series 3: STIR · sagittal · 3.0mm · 0.82mm/px · 8 of 19 slices shown]
[im 1/19]
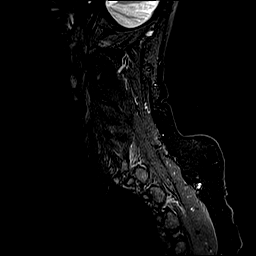
[im 3/19]
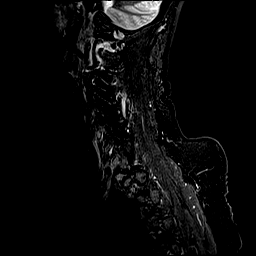
[im 6/19]
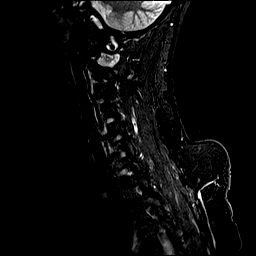
[im 8/19]
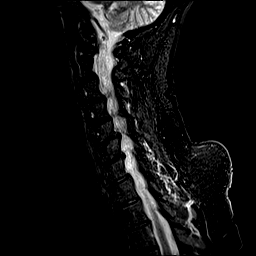
[im 11/19]
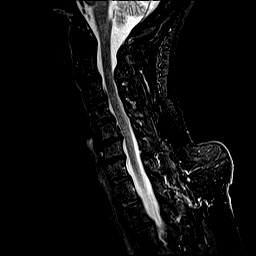
[im 13/19]
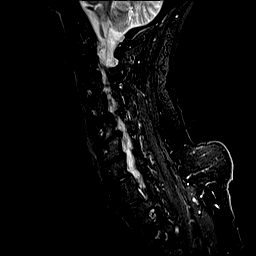
[im 16/19]
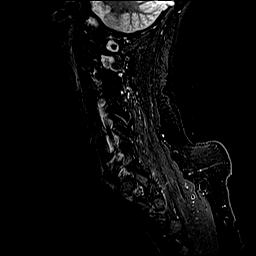
[im 19/19]
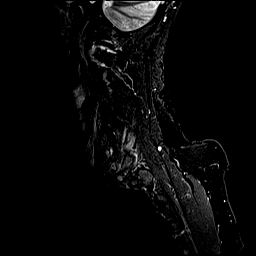

[Series 4: T1 · sagittal · 3.0mm · 0.82mm/px · 8 of 19 slices shown]
[im 1/19]
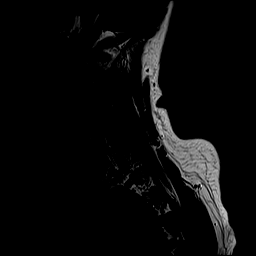
[im 3/19]
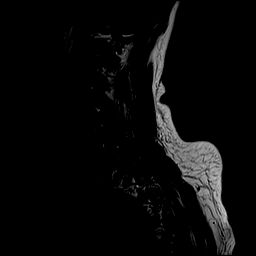
[im 6/19]
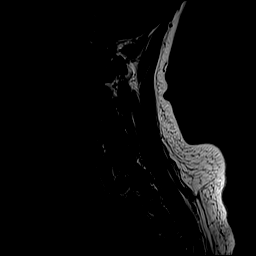
[im 8/19]
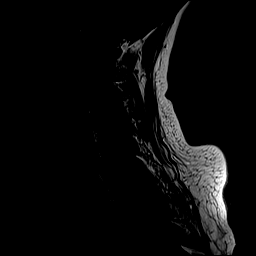
[im 11/19]
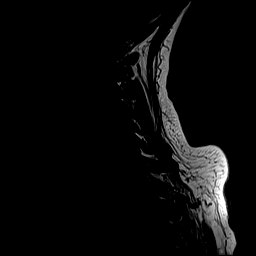
[im 13/19]
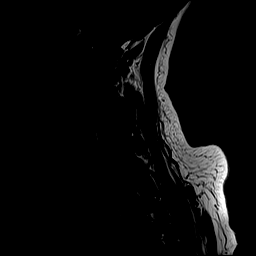
[im 16/19]
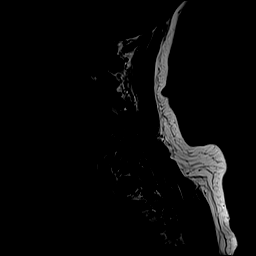
[im 19/19]
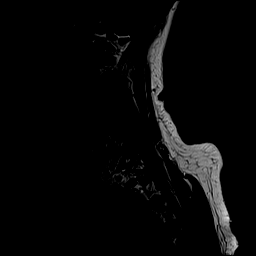

[Series 5: T2 · axial · 3.0mm · 0.70mm/px · z∈[-68,+30]mm · 9 of 28 slices shown (2 of 2)]
[im 1/28]
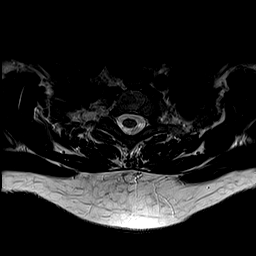
[im 5/28]
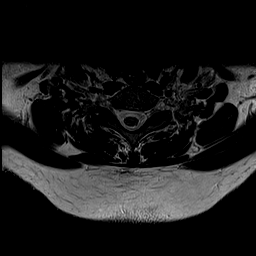
[im 8/28]
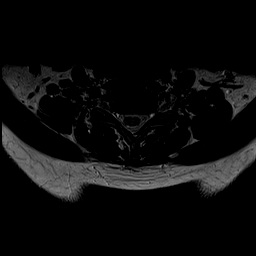
[im 13/28]
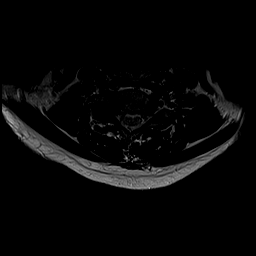
[im 15/28]
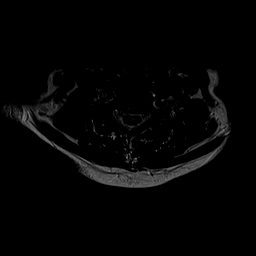
[im 20/28]
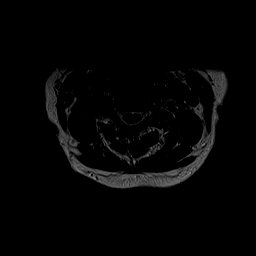
[im 23/28]
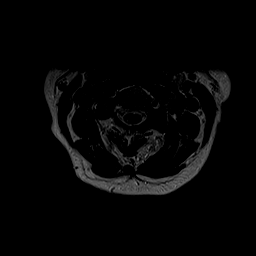
[im 25/28]
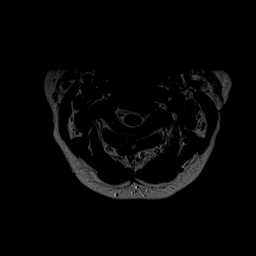
[im 28/28]
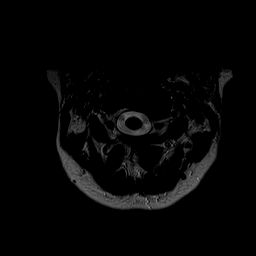

[Series 6: GRE · axial · 3.0mm · 0.35mm/px · z∈[-68,-54]mm · 2 of 28 slices shown]
[im 1/28]
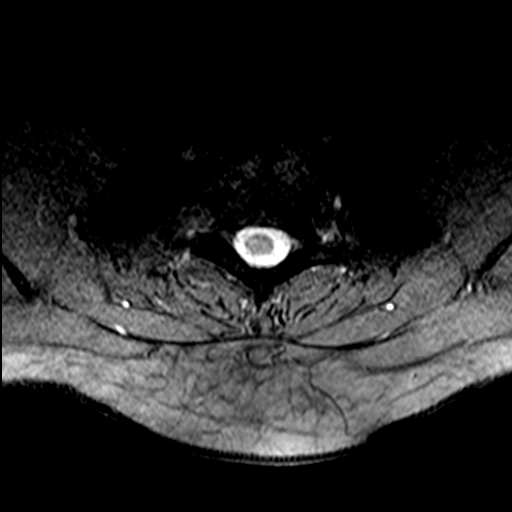
[im 5/28]
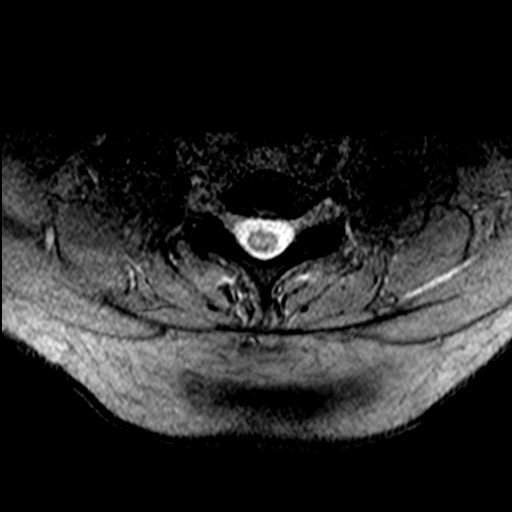

[35 of 48 positions shown; findings below may reference images not displayed]

FINDINGS: MRI CERVICAL SPINE FINDINGS

The study is partially degraded by motion.

Alignment: Straightening of the cervical curvature.

Vertebrae: Small anterolisthesis at C2-3, C3-4, C4-5, C5-6 and
C7-T1.

Cord: Mild mass effect on the cord at C5-6. No cord signal
abnormality.

Posterior Fossa, vertebral arteries, paraspinal tissues: Negative.

Disc levels:

C2-3: Minimal disc bulge without significant spinal canal stenosis.
Uncovertebral and facet degenerative changes and moderate bilateral
neural foraminal narrowing.

C3-4: Minimal disc bulge without significant spinal canal stenosis.
Uncovertebral and facet degenerative changes resulting in moderate
bilateral neural foraminal narrowing.

C4-5: Posterior disc osteophyte complex resulting in mild spinal
canal stenosis. Uncovertebral and facet degenerative changes
resulting in mild right and moderate left neural foraminal
narrowing.

C5-6: Posterior disc osteophyte complex resulting in mild spinal
canal stenosis with mild mass effect on the anterior cord surface.
Uncovertebral and facet degenerative changes resulting in mild
bilateral neural foraminal narrowing.

C6-7: Posterior disc osteophyte complex without significant spinal
canal stenosis. Uncovertebral and facet degenerative changes
resulting in mild bilateral neural foraminal narrowing.

C7-T1: Right uncovertebral with and bilateral facet degenerative
changes. No significant spinal canal or neural foraminal stenosis.

MRI LUMBAR SPINE FINDINGS

Segmentation:  Standard.

Alignment:  Physiologic.

Vertebrae:  No fracture, evidence of discitis, or bone lesion.

Conus medullaris and cauda equina: Conus extends to the L1-2 level.
Conus and cauda equina appear normal.

Paraspinal and other soft tissues: Negative.

Disc levels:

T12-L1:No spinal canal or neural foraminal stenosis.

L1-2:No spinal canal or neural foraminal stenosis.

L2-3:No spinal canal or neural foraminal stenosis.

L3-4:Loss of disc height, disc bulge, mild facet degenerative
changes and ligamentum flavum redundancy resulting in mild spinal
canal stenosis. No significant neural foraminal narrowing.

L4-5: Disc bulge, moderate facet degenerative changes with bilateral
joint effusion and ligamentum flavum redundancy resulting in mild
spinal canal stenosis with mild narrowing of the bilateral
subarticular zones. No significant neural foraminal narrowing.

L5-S1:Left asymmetric disc bulge and mild-to-moderate facet
degenerative changes resulting in mild left neural foraminal
narrowing. No significant spinal canal stenosis.
IMPRESSION: 1. Degenerative changes of the cervical spine with mild spinal canal
stenosis at C4-5 and C5-6 with mild mass effect on the cord at C5-6.
2. High-grade neural foraminal narrowing bilaterally at C2-3 and
C3-4 and on the left at C4-5.
3. Mild spinal canal stenosis at L3-4 and L4-5.
4. Mild left neural foraminal narrowing at L5-S1.
5. Facet arthropathy of the lower lumbar spine, more pronounced at
L4-5 where there is bilateral joint effusion.

## 2021-10-12 IMAGING — MR MR LUMBAR SPINE W/O CM
4 of 5 series · 26 of 48 positions shown · non-contrast
Comparison: None Available.

CLINICAL DATA: Spondylosis without myelopathy or radiculopathy,
cervical region [GZ] ([GZ]-CM).

Lumbar spondylosis [GZ] ([GZ]-CM).
EXAM:
MRI CERVICAL AND LUMBAR SPINE WITHOUT CONTRAST
TECHNIQUE: Multiplanar and multiecho pulse sequences of the cervical spine, to
include the craniocervical junction and cervicothoracic junction,
and lumbar spine, were obtained without intravenous contrast.

[Series 3: T2 · sagittal · 4.0mm · 1.09mm/px · 6 of 18 slices shown (1 of 2)]
[im 1/18]
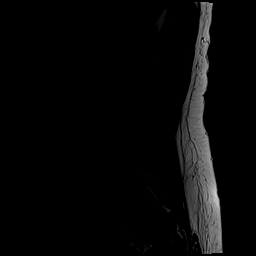
[im 4/18]
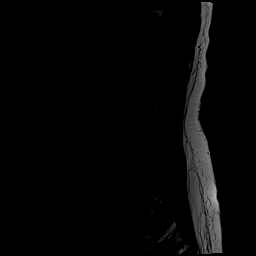
[im 7/18]
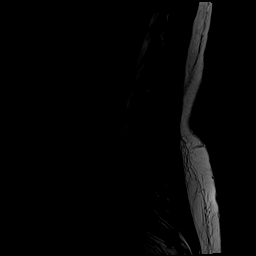
[im 11/18]
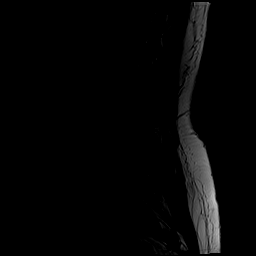
[im 14/18]
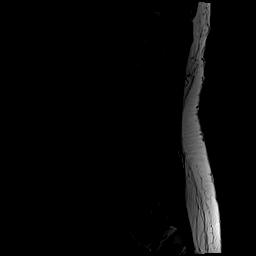
[im 18/18]
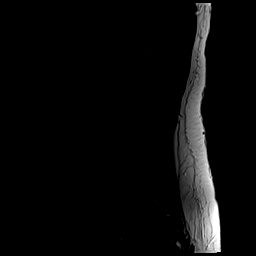

[Series 5: T1 · sagittal · 4.0mm · 1.09mm/px · 6 of 18 slices shown (1 of 2)]
[im 1/18]
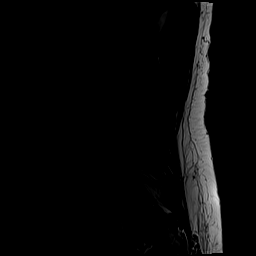
[im 4/18]
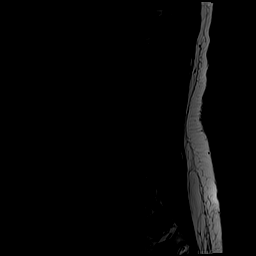
[im 7/18]
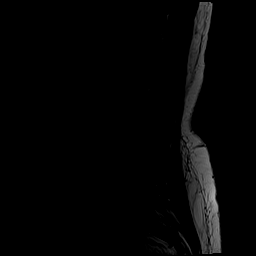
[im 11/18]
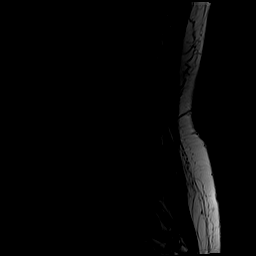
[im 14/18]
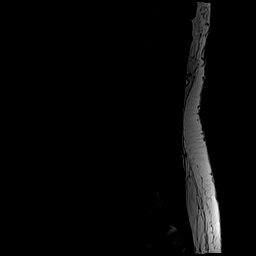
[im 18/18]
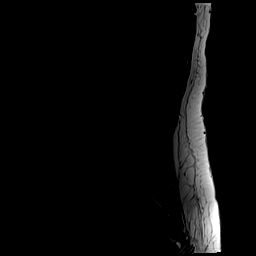

[Series 6: T2 · axial · 4.0mm · 0.39mm/px · z∈[-472,-232]mm · 9 of 45 slices shown (2 of 2)]
[im 1/45]
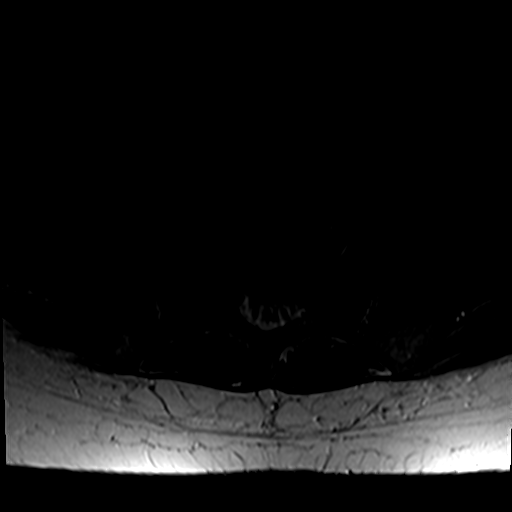
[im 7/45]
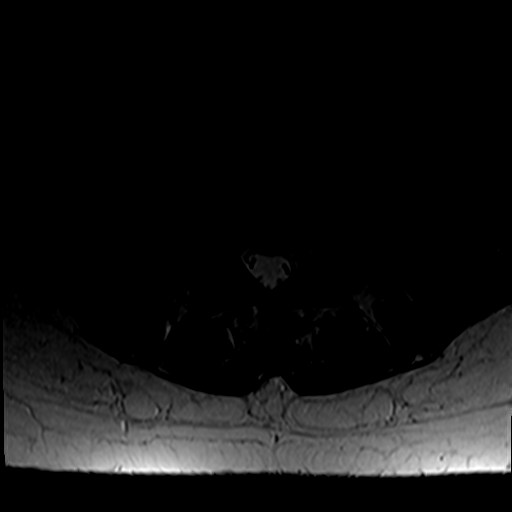
[im 13/45]
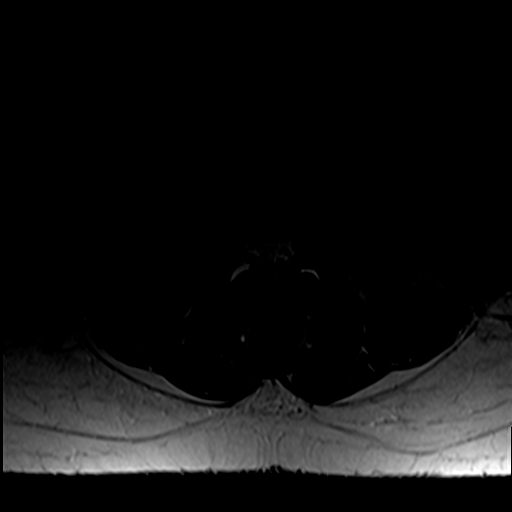
[im 19/45]
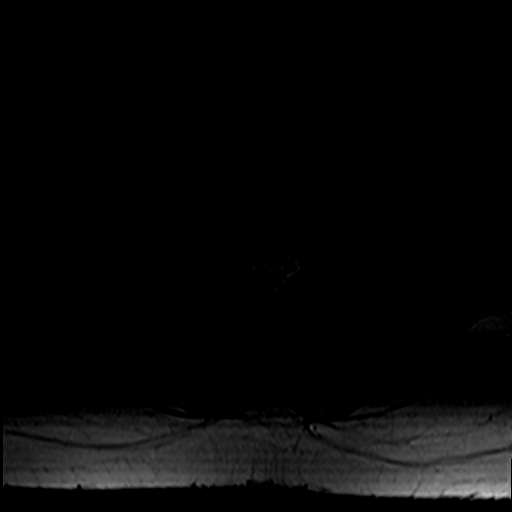
[im 23/45]
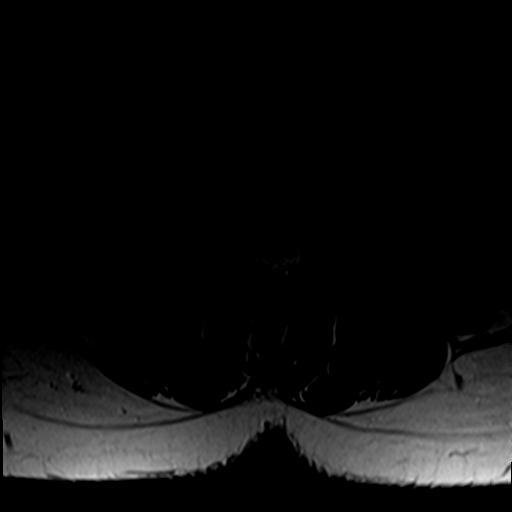
[im 26/45]
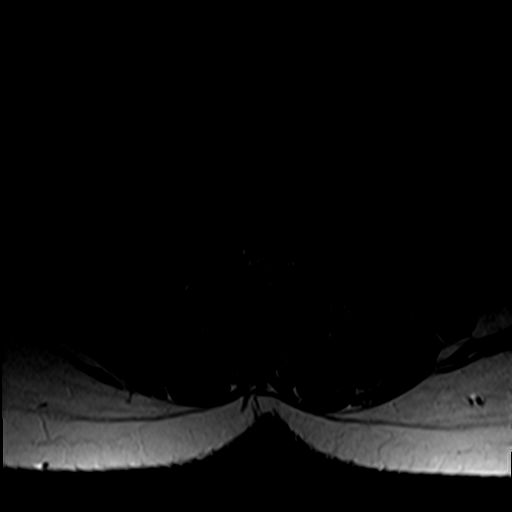
[im 32/45]
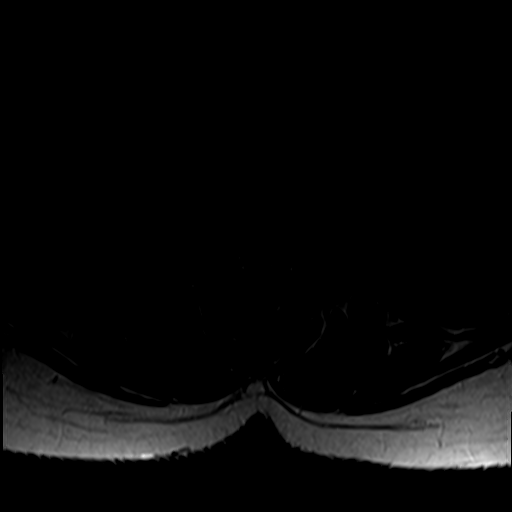
[im 38/45]
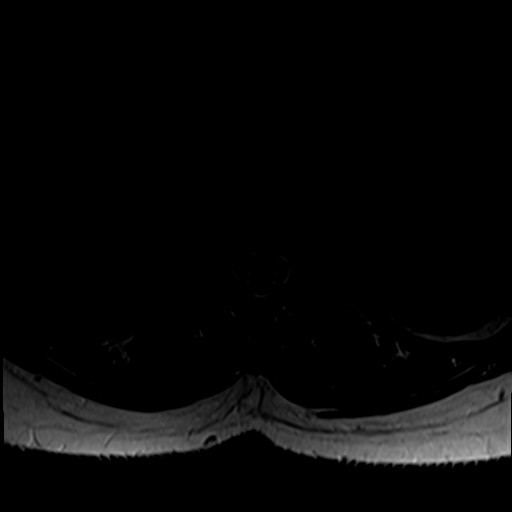
[im 45/45]
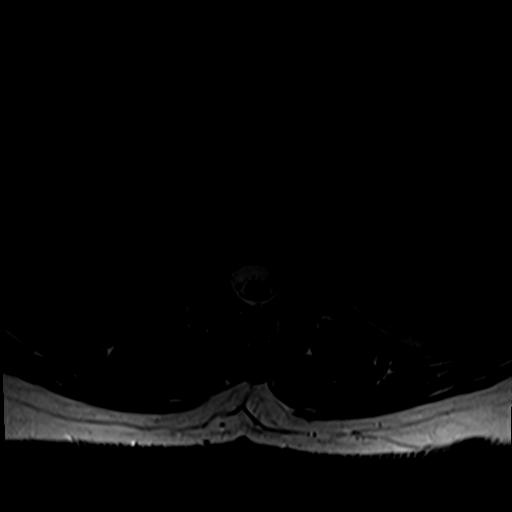

[Series 7: T1 · axial · 4.0mm · 0.39mm/px · z∈[-472,-265]mm · 5 of 45 slices shown (2 of 2)]
[im 1/45]
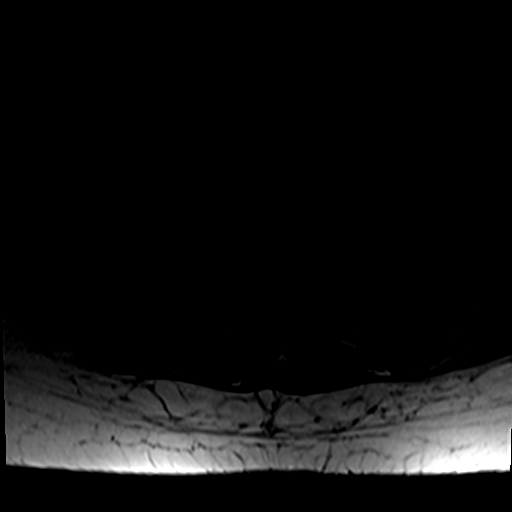
[im 7/45]
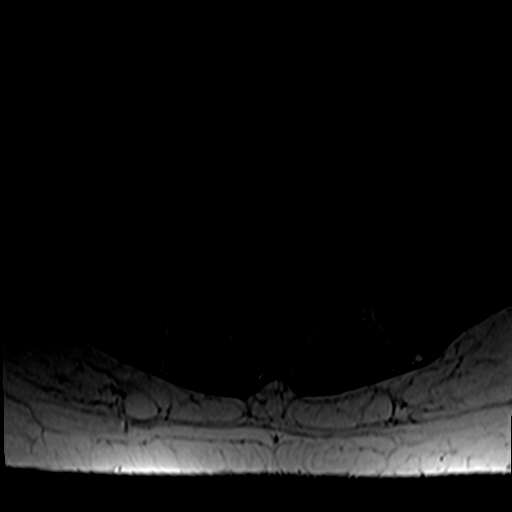
[im 13/45]
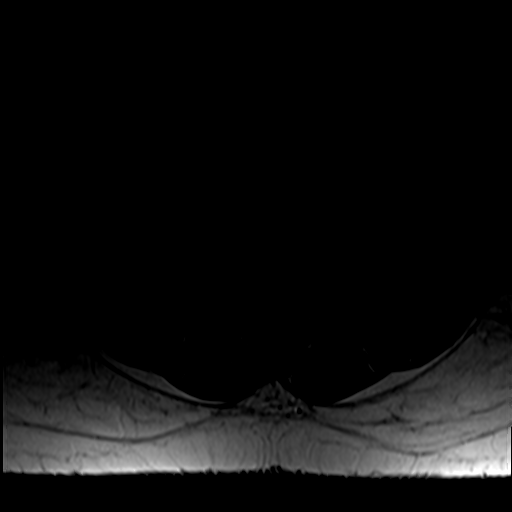
[im 23/45]
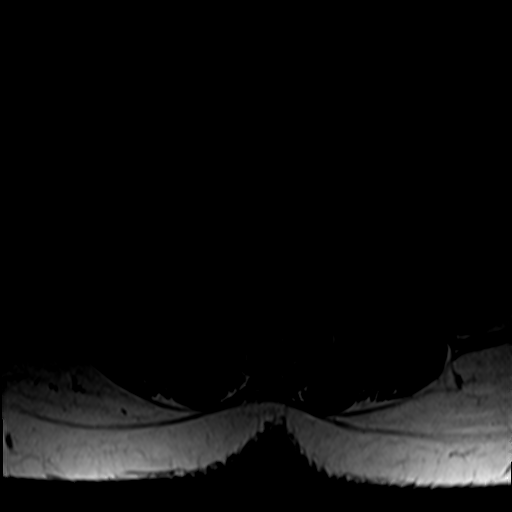
[im 38/45]
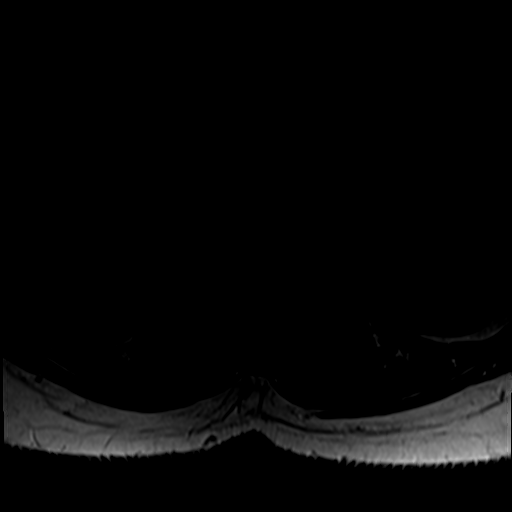

[26 of 48 positions shown; findings below may reference images not displayed]

FINDINGS: MRI CERVICAL SPINE FINDINGS

The study is partially degraded by motion.

Alignment: Straightening of the cervical curvature.

Vertebrae: Small anterolisthesis at C2-3, C3-4, C4-5, C5-6 and
C7-T1.

Cord: Mild mass effect on the cord at C5-6. No cord signal
abnormality.

Posterior Fossa, vertebral arteries, paraspinal tissues: Negative.

Disc levels:

C2-3: Minimal disc bulge without significant spinal canal stenosis.
Uncovertebral and facet degenerative changes and moderate bilateral
neural foraminal narrowing.

C3-4: Minimal disc bulge without significant spinal canal stenosis.
Uncovertebral and facet degenerative changes resulting in moderate
bilateral neural foraminal narrowing.

C4-5: Posterior disc osteophyte complex resulting in mild spinal
canal stenosis. Uncovertebral and facet degenerative changes
resulting in mild right and moderate left neural foraminal
narrowing.

C5-6: Posterior disc osteophyte complex resulting in mild spinal
canal stenosis with mild mass effect on the anterior cord surface.
Uncovertebral and facet degenerative changes resulting in mild
bilateral neural foraminal narrowing.

C6-7: Posterior disc osteophyte complex without significant spinal
canal stenosis. Uncovertebral and facet degenerative changes
resulting in mild bilateral neural foraminal narrowing.

C7-T1: Right uncovertebral with and bilateral facet degenerative
changes. No significant spinal canal or neural foraminal stenosis.

MRI LUMBAR SPINE FINDINGS

Segmentation:  Standard.

Alignment:  Physiologic.

Vertebrae:  No fracture, evidence of discitis, or bone lesion.

Conus medullaris and cauda equina: Conus extends to the L1-2 level.
Conus and cauda equina appear normal.

Paraspinal and other soft tissues: Negative.

Disc levels:

T12-L1:No spinal canal or neural foraminal stenosis.

L1-2:No spinal canal or neural foraminal stenosis.

L2-3:No spinal canal or neural foraminal stenosis.

L3-4:Loss of disc height, disc bulge, mild facet degenerative
changes and ligamentum flavum redundancy resulting in mild spinal
canal stenosis. No significant neural foraminal narrowing.

L4-5: Disc bulge, moderate facet degenerative changes with bilateral
joint effusion and ligamentum flavum redundancy resulting in mild
spinal canal stenosis with mild narrowing of the bilateral
subarticular zones. No significant neural foraminal narrowing.

L5-S1:Left asymmetric disc bulge and mild-to-moderate facet
degenerative changes resulting in mild left neural foraminal
narrowing. No significant spinal canal stenosis.
IMPRESSION: 1. Degenerative changes of the cervical spine with mild spinal canal
stenosis at C4-5 and C5-6 with mild mass effect on the cord at C5-6.
2. High-grade neural foraminal narrowing bilaterally at C2-3 and
C3-4 and on the left at C4-5.
3. Mild spinal canal stenosis at L3-4 and L4-5.
4. Mild left neural foraminal narrowing at L5-S1.
5. Facet arthropathy of the lower lumbar spine, more pronounced at
L4-5 where there is bilateral joint effusion.

## 2021-10-14 ENCOUNTER — Other Ambulatory Visit: Payer: Self-pay | Admitting: Nurse Practitioner

## 2021-10-14 ENCOUNTER — Ambulatory Visit
Admission: RE | Admit: 2021-10-14 | Discharge: 2021-10-14 | Disposition: A | Discharge status: Home / Self Care | Payer: No Typology Code available for payment source | Source: Ambulatory Visit | Attending: Nurse Practitioner | Admitting: Nurse Practitioner

## 2021-10-14 DIAGNOSIS — M545 Low back pain, unspecified: Secondary | ICD-10-CM

## 2021-10-14 IMAGING — MR MR THORACIC SPINE W/O CM
4 of 6 series · 20 of 48 positions shown · non-contrast
Comparison: None Available.

CLINICAL DATA: Low back pain, unspecified back pain laterality,
unspecified chronicity, unspecified whether sciatica present [6F]
([6F]-CM).

EXAM:
MRI THORACIC SPINE WITHOUT CONTRAST
TECHNIQUE: Multiplanar, multisequence MR imaging of the thoracic spine was
performed. No intravenous contrast was administered.

[Series 3: T1 · sagittal · 3.0mm · 1.06mm/px · 2 of 7 slices shown]
[im 1/7]
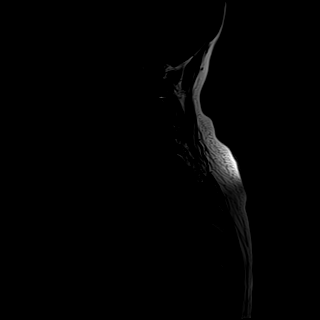
[im 7/7]
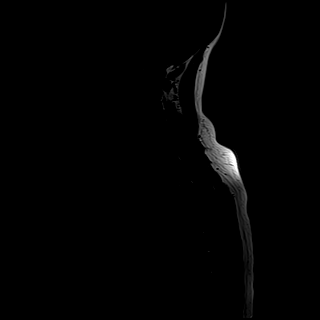

[Series 4: T2 · sagittal · 4.0mm · 0.48mm/px · 6 of 17 slices shown (1 of 3)]
[im 1/17]
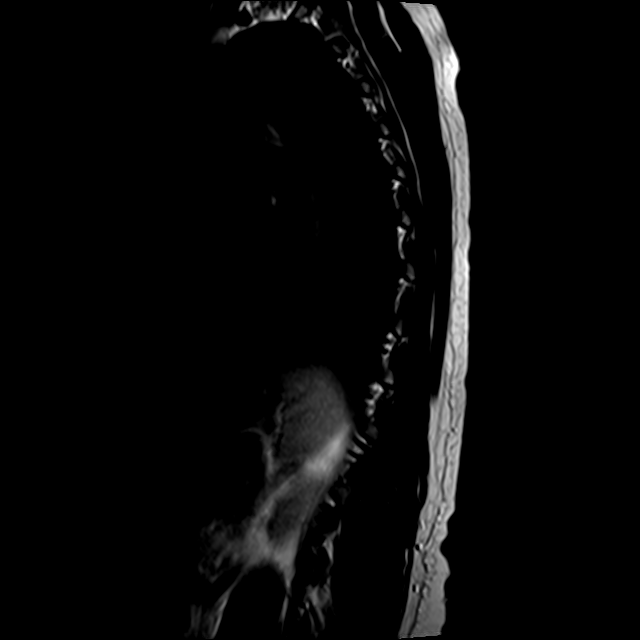
[im 4/17]
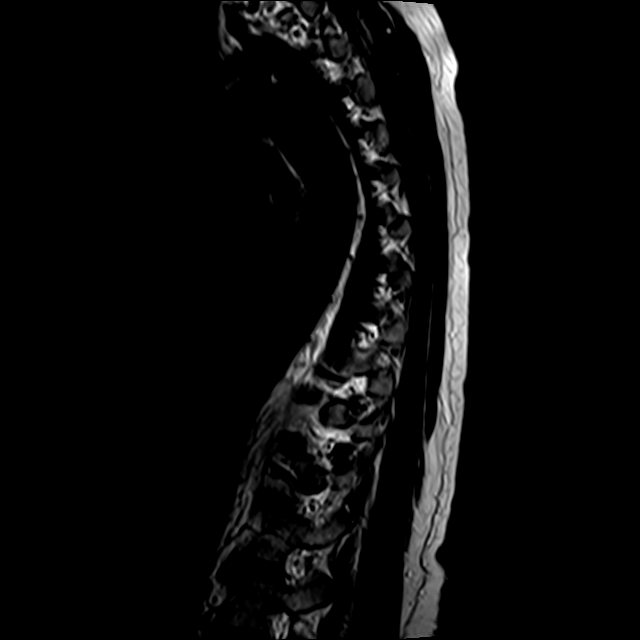
[im 7/17]
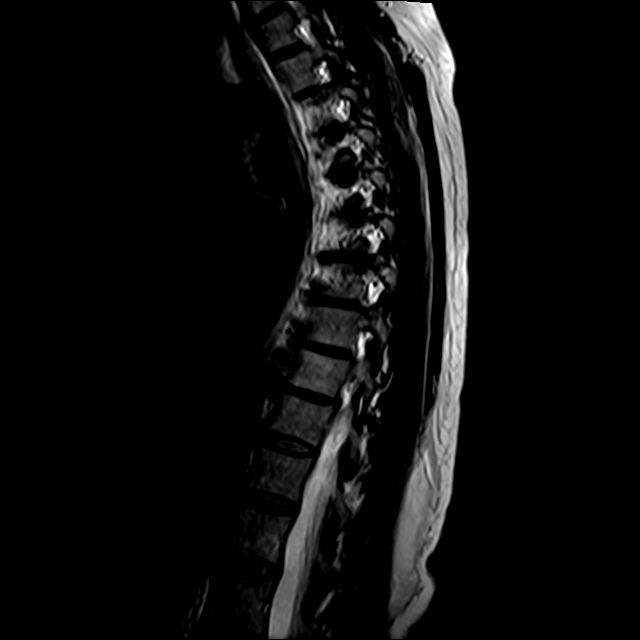
[im 10/17]
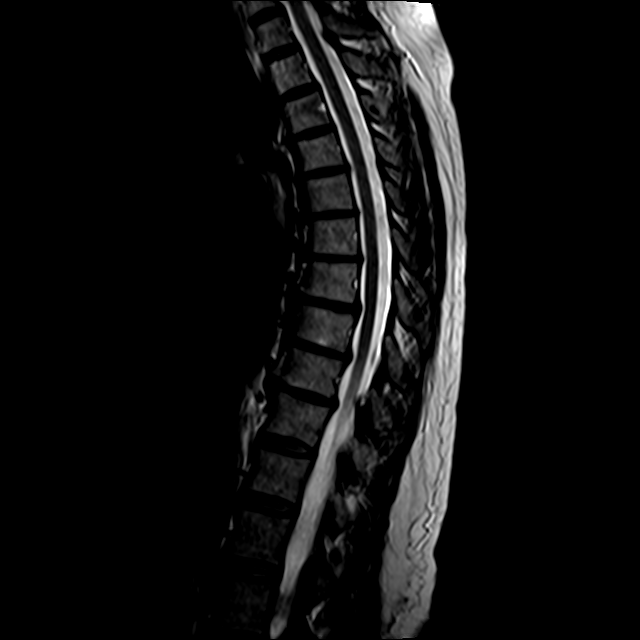
[im 13/17]
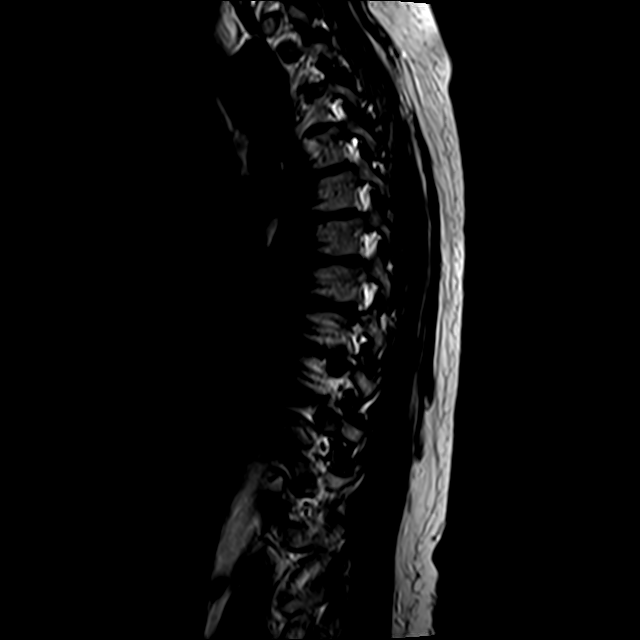
[im 17/17]
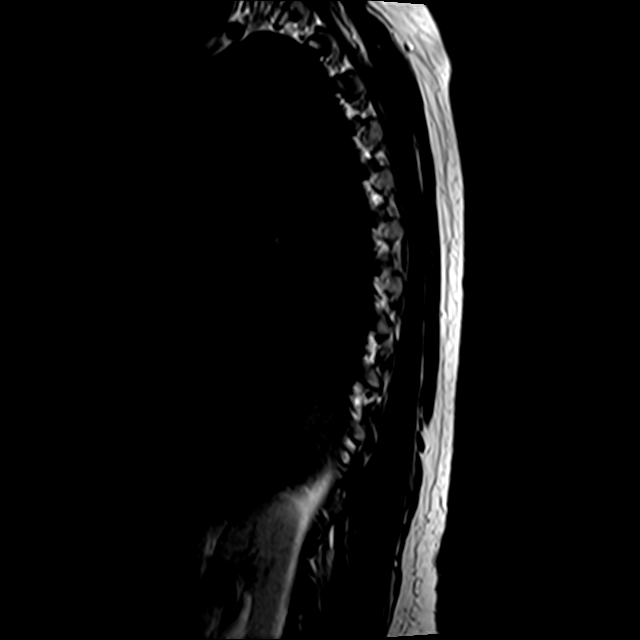

[Series 7: T2 · axial · 4.0mm · 0.39mm/px · z∈[-242,+1]mm · 8 of 39 slices shown (2 of 3)]
[im 1/39]
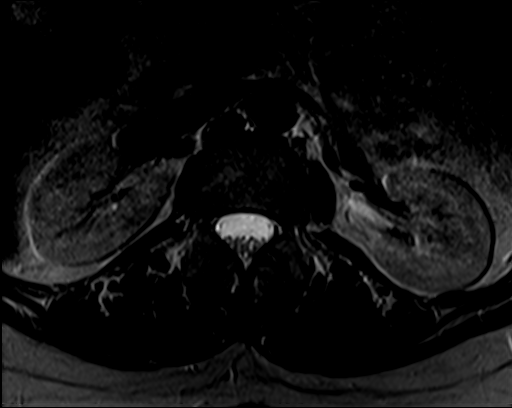
[im 6/39]
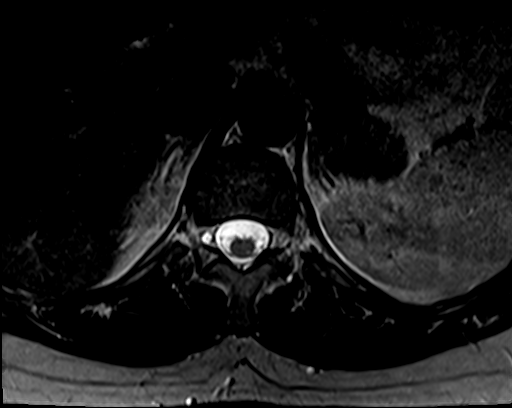
[im 12/39]
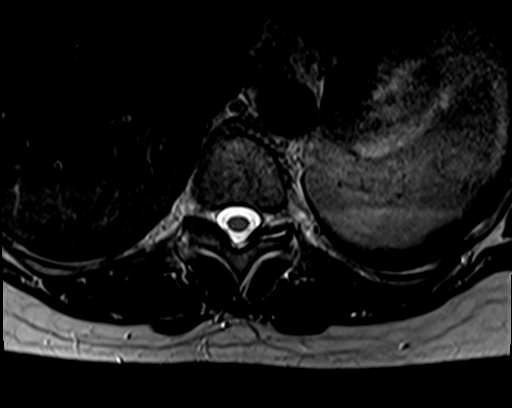
[im 18/39]
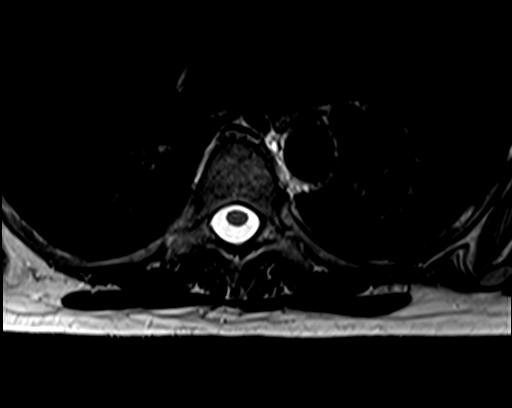
[im 21/39]
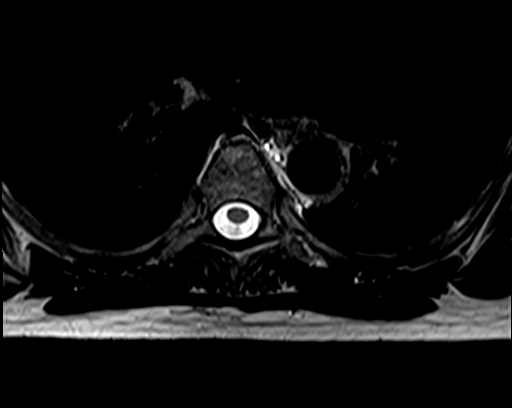
[im 27/39]
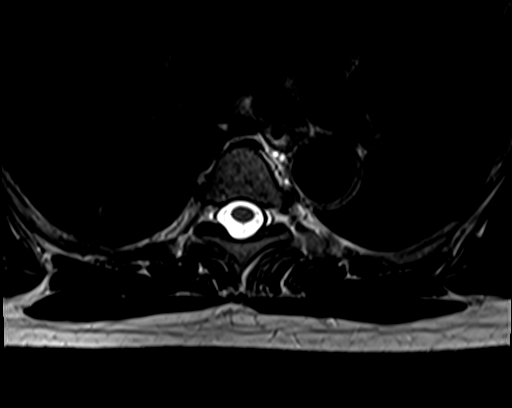
[im 33/39]
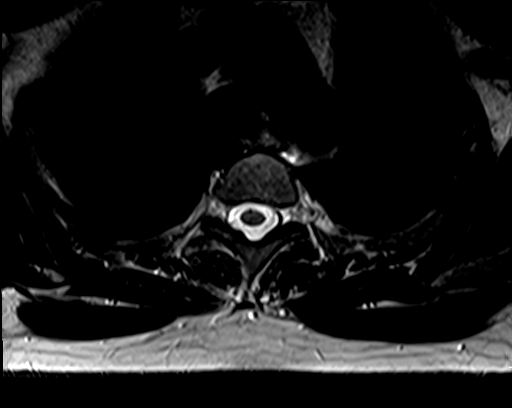
[im 39/39]
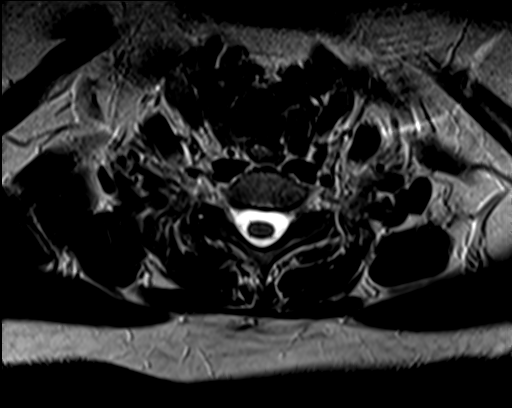

[Series 8: T2 · axial · 4.0mm · 0.39mm/px · z∈[-245,-36]mm · 4 of 40 slices shown (3 of 3)]
[im 1/40]
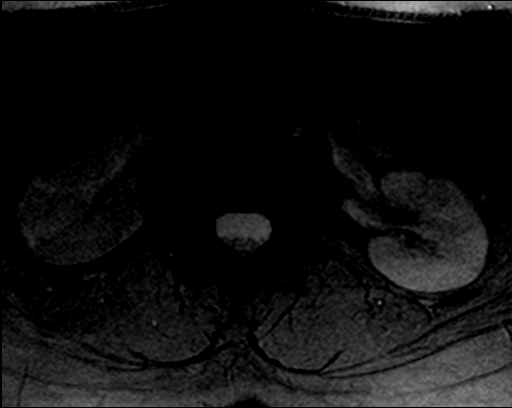
[im 7/40]
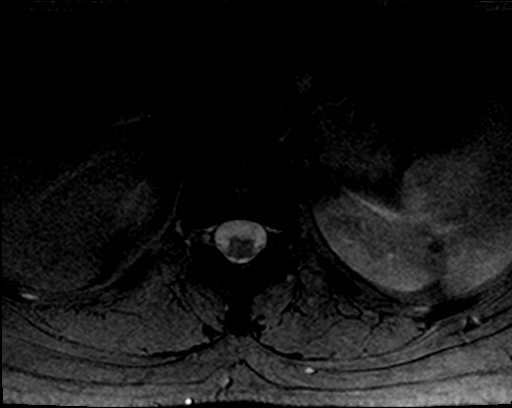
[im 22/40]
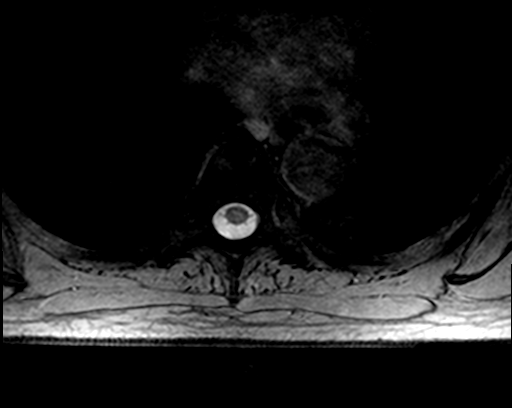
[im 34/40]
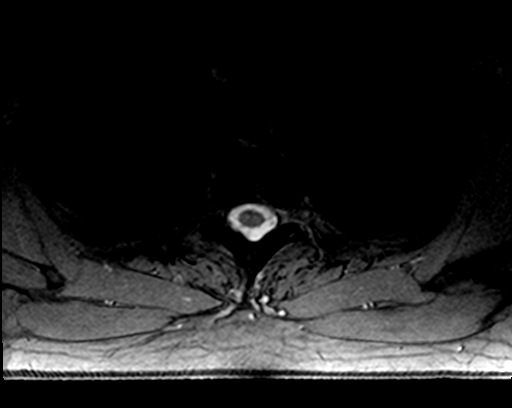

[20 of 48 positions shown; findings below may reference images not displayed]

FINDINGS: Alignment:  Dextroconvex scoliosis.

Vertebrae: No fracture, evidence of discitis, or bone lesion.

Cord:  Normal signal and morphology.

Paraspinal and other soft tissues: Negative.

Disc levels:

Tiny left posterolateral disc protrusions from T2-3 through T6-7,
central disc protrusion at T7-8 and right posterolateral at T8-9 and
T9-10 causing minimal indentation on the thecal sac without
significant spinal canal stenosis.

Mild facet degenerative changes at T10-11, right greater than left
where there is trace joint effusion.

No significant neural foraminal narrowing.
IMPRESSION: 1. Mild degenerative disc disease from T2-3 through T9-10 and mild
facet degenerative changes at T10-11.
2. No high-grade spinal canal or neural foraminal stenosis at any
level.

## 2022-07-24 ENCOUNTER — Telehealth: Payer: Self-pay

## 2022-07-24 NOTE — Telephone Encounter (Signed)
Returned [employer], she is asking on behalf of her mother who lived in [place] for 20 years. She will have PCP send in referral. Advised that treatment plan will be determined by provider's evaluation and patient's symptoms/history.   Beryle Flock, RN

## 2022-07-24 NOTE — Telephone Encounter (Signed)
Alexandra [name] has questions regarding parasite treatment plans at our clinic. She wants to know what to expect and how testing is conducted. A referral is being sent in. She can be reached at [phone].
